# Patient Record
Sex: Female | Born: 1975 | Race: White | Hispanic: No | Marital: Married | Smoking: Never smoker
Health system: Southern US, Community
[De-identification: ages and names within clinical notes are randomized; demographics above are authoritative.]

## PROBLEM LIST (undated history)

## (undated) DIAGNOSIS — N200 Calculus of kidney: Secondary | ICD-10-CM

## (undated) DIAGNOSIS — Z789 Other specified health status: Secondary | ICD-10-CM

## (undated) DIAGNOSIS — F419 Anxiety disorder, unspecified: Secondary | ICD-10-CM

## (undated) HISTORY — PX: DILATION AND CURETTAGE OF UTERUS: SHX78

## (undated) HISTORY — PX: OTHER SURGICAL HISTORY: SHX169

## (undated) HISTORY — PX: PILONIDAL CYST EXCISION: SHX744

---

## 2006-05-02 ENCOUNTER — Inpatient Hospital Stay (HOSPITAL_COMMUNITY): Admission: AD | Admit: 2006-05-02 | Discharge: 2006-05-03 | Payer: Self-pay | Admitting: Family Medicine

## 2006-09-29 ENCOUNTER — Ambulatory Visit: Payer: Self-pay | Admitting: Family Medicine

## 2007-08-31 ENCOUNTER — Encounter (INDEPENDENT_AMBULATORY_CARE_PROVIDER_SITE_OTHER): Payer: Self-pay | Admitting: Obstetrics and Gynecology

## 2007-08-31 ENCOUNTER — Ambulatory Visit (HOSPITAL_COMMUNITY): Admission: RE | Admit: 2007-08-31 | Discharge: 2007-08-31 | Payer: Self-pay | Admitting: Obstetrics and Gynecology

## 2008-11-20 ENCOUNTER — Inpatient Hospital Stay (HOSPITAL_COMMUNITY): Admission: AD | Admit: 2008-11-20 | Discharge: 2008-11-21 | Payer: Self-pay | Admitting: Obstetrics and Gynecology

## 2009-10-04 ENCOUNTER — Ambulatory Visit: Payer: Self-pay | Admitting: Family Medicine

## 2009-10-04 DIAGNOSIS — J309 Allergic rhinitis, unspecified: Secondary | ICD-10-CM | POA: Insufficient documentation

## 2009-10-04 DIAGNOSIS — N943 Premenstrual tension syndrome: Secondary | ICD-10-CM | POA: Insufficient documentation

## 2010-03-26 NOTE — Assessment & Plan Note (Signed)
Summary: CONSULT RE: ?ALLERGIES/CJR   Vital Signs:  Patient profile:   35 year old female Weight:      105 pounds Temp:     98.2 degrees F oral BP sitting:   110 / 80  (left arm) Cuff size:   regular  Vitals Entered By: Kathrynn Speed CMA (October 04, 2009 1:54 PM) CC: consult allergies, congestion in throat, all summer, headaches once a month around [place],src   CC:  consult allergies, congestion in throat, all summer, headaches once a month around [place], and src.  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  She typically has seasonal allergic rhinitis in the spring and fall, now it's occurring in the summer.  Her major symptoms is postnasal drip and scratchy throat.  She no history of asthma.  She also has headaches about 3 days for period starts.  She describes in his posterior a 3 on a scale of one to 10.  No migraine symptoms.  She also is extremely light skin and light eyes and we will do a thorough skin exam  Preventive Screening-Counseling & Management  Alcohol-Tobacco     Smoking Status: never  Caffeine-Diet-Exercise     Does Patient Exercise: yes      Drug Use:  no.    Current Medications (verified): 1)  Multivitamins  Tabs (Multiple Vitamin) .... Once Day  Allergies (verified): No Known Drug Allergies  Past History:  Past Medical History: cb x 4  Social History: Reviewed history and no changes required. Occupation:  Charity fundraiser Married Never Smoked Alcohol use-no Drug use-no Regular exercise-yes Smoking Status:  never Drug Use:  no Does Patient Exercise:  yes  Review of Systems      See HPI  Physical Exam  General:  Well-developed,well-nourished,in no acute distress; alert,appropriate and cooperative throughout examination Head:  Normocephalic and atraumatic without obvious abnormalities. No apparent alopecia or balding. Eyes:  No corneal or conjunctival  inflammation noted. EOMI. Perrla. Funduscopic exam benign, without hemorrhages, exudates or papilledema. Vision grossly normal. Ears:  External ear exam shows no significant lesions or deformities.  Otoscopic examination reveals clear canals, tympanic membranes are intact bilaterally without bulging, retraction, inflammation or discharge. Hearing is grossly normal bilaterally. Nose:  septal deviation to the left Mouth:  Oral mucosa and oropharynx without lesions or exudates.  Teeth in good repair. Neck:  No deformities, masses, or tenderness noted. Chest Wall:  No deformities, masses, or tenderness noted. Skin:  Intact without suspicious lesions or rashes   Problems:  Medical Problems Added: 1)  Dx of Headache, Premenstrual  (ICD-625.4) 2)  Dx of Rhinitis  (ICD-477.9)  Impression & Recommendations:  Problem # 1:  HEADACHE, PREMENSTRUAL (ICD-625.4) Assessment New  Problem # 2:  RHINITIS (ICD-477.9) Assessment: New  Complete Medication List: 1)  Multivitamins Tabs (Multiple vitamin) .... Once day  Patient Instructions: 1)  you may take either plain Claritin in the morning or plain Zyrtec at that time if you give these to a good trial and it does not work then we would consider adding a steroid nasal spray. 2)  Also, Motrin 400 mg b.i.d. starting a week before you.  To help prevent the menstrual migraines.  If this is not work call us and we would try low-dose Prozac. 3)  Continue to wear sunscreens because of your light skin. 4)  Eyes

## 2010-05-31 LAB — CBC
HCT: 18.5 % — ABNORMAL LOW (ref 36.0–46.0)
HCT: 34.9 % — ABNORMAL LOW (ref 36.0–46.0)
Hemoglobin: 12 g/dL (ref 12.0–15.0)
Hemoglobin: 6.5 g/dL — CL (ref 12.0–15.0)
MCHC: 35.2 g/dL (ref 30.0–36.0)
Platelets: 199 10*3/uL (ref 150–400)
RBC: 1.88 MIL/uL — ABNORMAL LOW (ref 3.87–5.11)
RBC: 3.55 MIL/uL — ABNORMAL LOW (ref 3.87–5.11)
WBC: 10.4 10*3/uL (ref 4.0–10.5)

## 2010-07-09 NOTE — Op Note (Signed)
[name], Amber NO.:  [id number]   MEDICAL RECORD NO.:  [id number]          PATIENT TYPE:  AMB   LOCATION:  SDC                           FACILITY:  [facility]   PHYSICIAN:  Osborn Coho, M.D.   DATE OF BIRTH:  05-26-1975   DATE OF PROCEDURE:  08/31/2007  DATE OF DISCHARGE:                               OPERATIVE REPORT   PREOPERATIVE DIAGNOSIS:  Missed abortion.   POSTOPERATIVE DIAGNOSIS:  Incomplete abortion.   PROCEDURE:  Dilation and curettage.   ATTENDING:  Osborn Coho, MD   ANESTHESIA:  MAC.   SPECIMEN TO PATHOLOGY:  Products of conception.   FLUIDS:  1200 mL   URINE OUTPUT:  Voided prior to procedure.   ESTIMATED BLOOD LOSS:  Minimal.   COMPLICATIONS:  None.   PROCEDURE:  The patient was taken to the operating room and after the  risks, benefits and alternatives discussed with the patient, the patient  verbalized understanding and consent was signed and witnessed.  The  patient was placed under MAC per Anesthesia and prepped and draped in  the normal sterile fashion in the dorsal lithotomy position.  A bivalve  speculum was placed in the patient's vagina and a paracervical block  administered using a total of 10 mL of 1% lidocaine.  The anterior lip  of the cervix was grasped with a single-tooth tenaculum and the uterus  sounded to 7.5 cm.  A size 7 suction curette was used and suction and  curettage was performed until minimal tissue was returning.  Sharp  curettage was performed until a gritty texture was noted and suction and  curettage performed once again to remove any remaining debris.  There  was some oozing noted and secondary to the patient's history of  hemorrhage status post deliveries, decision was made to give the patient  a dose of Methergine.  The tenaculum was removed and there was some  bleeding at the tenaculum site which was made hemostatic with silver  nitrate.  There was minimal bleeding noted at this time.  Count  was  correct.  Speculum was removed as well.  The patient tolerated the  procedure well and is awaiting return to the recovery room in good  condition.     Osborn Coho, M.D.  Electronically Signed    AR/MEDQ  D:  08/31/2007  T:  09/01/2007  Job:  [id number]

## 2010-07-12 NOTE — H&P (Signed)
NAMERASCHELLE, WISENBAKER NO.:  [id number]   MEDICAL RECORD NO.:  [id number]          PATIENT TYPE:  INP   LOCATION:  9164                          [facility]ACILITY:  [facility]   PHYSICIAN:  Osborn Coho, M.D.   DATE OF BIRTH:  Mar 24, 1975   DATE OF ADMISSION:  05/02/2006  DATE OF DISCHARGE:                              HISTORY & PHYSICAL   Ms. Fazzino is a 35 year old married white female gravida 3, para 2-0-0-2  at 39-3/7th weeks who presents with regular uterine contractions that  began at approximately 4 p.m.  She denies bleeding or leaking of fluid.  No signs or symptoms of PIH.  No nausea, vomiting or diarrhea.  Her  pregnancy has been followed by the [facility] certified  nurse midwife service and has been remarkable for transfer of care to [place]  at 26 weeks' gestation from [place], [place], #2 Group B strep negative.   Her prenatal labs were collected on January 29, 2006.  Hemoglobin 12.7,  hematocrit 37.2, platelets 218,000.  Blood type O+, antibody negative,  RPR nonreactive, rubella immune, hepatitis B surface antigen negative,  HIV nonreactive.  Pap smear within normal limits and thyroid within  normal limits.  One-hour Glucola from February 12, 2006 was 196.  Three-  hour glucose tolerance test from March 06, 2006 was within normal  limits.  Culture of the vaginal tract for group B strep on April 06, 2006 was negative.   HISTORY OF PRESENT PREGNANCY:  The patient presented for care at [facility] on January 29, 2006 at 26-2/7th weeks gestation.  Care had  previously been provided by the [facility] in [place], [place].  Request of information for previous care was sent to her previous  office.  The patient had an elevated 1-hour Glucola but a normal 3-hour  GTT.  She remained normotensive and with no proteinuria throughout the  pregnancy.  She was measuring size equal to dates and the rest of her  prenatal care was unremarkable.   OBSTETRICAL HISTORY:  She is a gravida 3, para 2-0-0-2.  In August 2004,  she had a vaginal delivery of a female infant weighing 7 pounds 0 ounces  at 42 weeks' gestation after 12 hours in labor.  Infant's name was  Myriam Jacobson.  There were no complications with that pregnancy or birth and  that infant was delivered at home.  In March 2006, she had a vaginal  delivery of a female infant weighing 7 pounds 15 ounces at 42 weeks'  gestation after 7 hours of labor.  His name was Sherilyn Cooter.  There was no  complications and this birth to place at a birthing center.  The third  pregnancy is a current pregnancy.   MEDICAL HISTORY:  She has no medication allergies.  She experienced  menarche at the age of 40 with 28-day cycles lasting 5-6 days.  She  reports having had the usual childhood illnesses.   SURGICAL HISTORY:  Remarkable for pilonidal cyst which was removed in  1999.   FAMILY MEDICAL HISTORY:  She has aunts and  uncles with diabetes.  Mother  had some type of cancer.  First cousin with psychiatric illness.   GENETIC HISTORY:  Remarkable for father of the baby's mother has  thalassemia disorder and the patient's children are carrier.   SOCIAL HISTORY:  The patient is married to the father of the baby.  His  name is Greig Castilla.  He is involved and supportive.  They are of the  Catholic faith.  The patient has 16 years of education in nursing.  Father of the baby has 18 years of education and is employed full-time  as an Art gallery manager.  They deny any alcohol, tobacco or illicit drug use with  the pregnancy.   OBJECTIVE:  VITAL SIGNS:  Stable.  She is afebrile.  HEENT:  Grossly within normal limits.  CHEST:  Clear to auscultation.  HEART:  Regular rate and rhythm.  ABDOMEN:  Gravid and contour with fundal height extending approximately  39 cm above pubic symphysis.  Fetal heart rate is reassuring.  Contractions are every 2-3 minutes.  Upon arrival to Labor delivery, the  patient was 10 cm dilated with  infant head crowning.  EXTREMITIES:  Within normal limits.   ASSESSMENT:  1. Intrauterine pregnancy at term.  2. Imminent delivery.   PLAN:  Admit to birthing suites and prepare for birth.      Cam Hai, C.N.M.      Osborn Coho, M.D.  Electronically Signed    KS/MEDQ  D:  05/02/2006  T:  05/02/2006  Job:  [id number]

## 2010-11-21 LAB — CBC
Hemoglobin: 13.8
MCHC: 34.9
MCV: 90.3
RBC: 4.38

## 2010-11-21 LAB — ABO/RH: ABO/RH(D): O POS

## 2011-01-30 ENCOUNTER — Encounter: Payer: Self-pay | Admitting: Family Medicine

## 2011-01-30 ENCOUNTER — Ambulatory Visit (INDEPENDENT_AMBULATORY_CARE_PROVIDER_SITE_OTHER): Payer: Managed Care, Other (non HMO) | Admitting: Family Medicine

## 2011-01-30 VITALS — BP 126/84 | Temp 98.6°F | Wt 114.0 lb

## 2011-01-30 DIAGNOSIS — F410 Panic disorder [episodic paroxysmal anxiety] without agoraphobia: Secondary | ICD-10-CM | POA: Insufficient documentation

## 2011-01-30 MED ORDER — LORAZEPAM 0.5 MG PO TABS: ORAL_TABLET | ORAL | Status: AC

## 2011-01-30 MED ORDER — CITALOPRAM HYDROBROMIDE 10 MG PO TABS: 10.0000 mg | ORAL_TABLET | Freq: Every day | ORAL | Status: DC

## 2011-01-30 NOTE — Progress Notes (Signed)
  Subjective:    Patient ID: [name], female    DOB: Feb 25, 1975, 35 y.o.   MRN: [id number]  HPI [name] is a 35 year old, married female, nonsmoker comes in today for evaluation of panic attacks.  She states she has been shy all her life and has had panic attacks since he was a child.  Recently seemed to be gotten worse.  She went to see a psychologist is going to work with her on visual imaging and the psychologist recommend she take some medication.  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  Does not smoke or drink.  LMP a month ago.  Birth control. ...........  Natural family planning   Review of Systems    General and psychiatric review of systems otherwise negative Objective:   Physical Exam Well-developed well-nourished, in no acute distress.  Psychiatric evaluation oriented x 3.  Her mood is good.  Insight good.  No depression       Assessment & Plan:  Panic attacks begin Celexa 10 mg nightly, Ativan .5 p.r.n. Return in 6 weeks

## 2011-01-30 NOTE — Patient Instructions (Signed)
Began Celexa 10 mg at bedtime.  Ativan p.r.n.  Follow-up in 6 weeks

## 2011-03-13 ENCOUNTER — Other Ambulatory Visit (INDEPENDENT_AMBULATORY_CARE_PROVIDER_SITE_OTHER): Payer: Managed Care, Other (non HMO)

## 2011-03-13 DIAGNOSIS — Z0000 Encounter for general adult medical examination without abnormal findings: Secondary | ICD-10-CM

## 2011-03-13 LAB — POCT URINALYSIS DIPSTICK
Bilirubin, UA: NEGATIVE
Blood, UA: NEGATIVE
Nitrite, UA: NEGATIVE
Urobilinogen, UA: 0.2
pH, UA: 7

## 2011-03-13 LAB — CBC WITH DIFFERENTIAL/PLATELET
Basophils Absolute: 0 10*3/uL (ref 0.0–0.1)
Eosinophils Absolute: 0.1 10*3/uL (ref 0.0–0.7)
Lymphocytes Relative: 34.3 % (ref 12.0–46.0)
MCHC: 34.6 g/dL (ref 30.0–36.0)
Neutrophils Relative %: 56.4 % (ref 43.0–77.0)
RDW: 12.7 % (ref 11.5–14.6)

## 2011-03-13 LAB — HEPATIC FUNCTION PANEL
Alkaline Phosphatase: 70 U/L (ref 39–117)
Bilirubin, Direct: 0 mg/dL (ref 0.0–0.3)

## 2011-03-13 LAB — LIPID PANEL
HDL: 60.1 mg/dL (ref >39.00)
LDL Cholesterol: 112 mg/dL — ABNORMAL HIGH (ref 0–99)
Total CHOL/HDL Ratio: 3
Triglycerides: 86 mg/dL (ref 0.0–149.0)

## 2011-03-13 LAB — BASIC METABOLIC PANEL
CO2: 26 mEq/L (ref 19–32)
Calcium: 9 mg/dL (ref 8.4–10.5)
Creatinine, Ser: 0.6 mg/dL (ref 0.4–1.2)
Glucose, Bld: 83 mg/dL (ref 70–99)

## 2011-04-01 ENCOUNTER — Encounter: Payer: Self-pay | Admitting: Family Medicine

## 2011-07-15 ENCOUNTER — Ambulatory Visit: Payer: Managed Care, Other (non HMO) | Admitting: Family Medicine

## 2011-12-06 ENCOUNTER — Other Ambulatory Visit: Payer: Self-pay | Admitting: Family Medicine

## 2012-06-04 LAB — OB RESULTS CONSOLE ABO/RH: RH Type: POSITIVE

## 2012-06-04 LAB — OB RESULTS CONSOLE RUBELLA ANTIBODY, IGM: Rubella: IMMUNE

## 2012-06-04 LAB — OB RESULTS CONSOLE GC/CHLAMYDIA: Gonorrhea: NEGATIVE

## 2012-06-04 LAB — OB RESULTS CONSOLE ANTIBODY SCREEN: Antibody Screen: NEGATIVE

## 2012-11-16 ENCOUNTER — Encounter (HOSPITAL_COMMUNITY): Payer: Self-pay | Admitting: Pharmacist

## 2012-11-18 ENCOUNTER — Encounter (HOSPITAL_COMMUNITY)
Admission: RE | Admit: 2012-11-18 | Discharge: 2012-11-18 | Disposition: A | Discharge status: Home / Self Care | Payer: Managed Care, Other (non HMO) | Source: Ambulatory Visit | Attending: Obstetrics and Gynecology | Admitting: Obstetrics and Gynecology

## 2012-11-18 ENCOUNTER — Encounter (HOSPITAL_COMMUNITY): Payer: Self-pay

## 2012-11-18 HISTORY — DX: Anxiety disorder, unspecified: F41.9

## 2012-11-18 HISTORY — DX: Other specified health status: Z78.9

## 2012-11-18 LAB — RPR: RPR Ser Ql: NONREACTIVE

## 2012-11-18 LAB — CBC
Hemoglobin: 11.1 g/dL — ABNORMAL LOW (ref 12.0–15.0)
MCH: 31.4 pg (ref 26.0–34.0)
MCHC: 35.2 g/dL (ref 30.0–36.0)
MCV: 89 fL (ref 78.0–100.0)

## 2012-11-18 NOTE — Patient Instructions (Addendum)
Your procedure is scheduled on:11/19/12  Enter through the Main Entrance at :12 noon Pick up desk phone and dial [phone] and inform us of your arrival.  Please call [phone] if you have any problems the morning of surgery.  Remember: Do not eat food after midnight:tonight Clear liquids are ok until: 9am Friday   You may brush your teeth the morning of surgery.   DO NOT wear jewelry, eye make-up, lipstick,body lotion, or dark fingernail polish.  (Polished toes are ok) You may wear deodorant.  If you are to be admitted after surgery, leave suitcase in car until your room has been assigned.

## 2012-11-19 ENCOUNTER — Encounter (HOSPITAL_COMMUNITY): Payer: Self-pay | Admitting: Anesthesiology

## 2012-11-19 ENCOUNTER — Encounter (HOSPITAL_COMMUNITY)
Admission: AD | Disposition: A | Discharge status: Home / Self Care | Payer: Self-pay | Source: Ambulatory Visit | Attending: Obstetrics and Gynecology

## 2012-11-19 ENCOUNTER — Inpatient Hospital Stay (HOSPITAL_COMMUNITY)
Admission: AD | Admit: 2012-11-19 | Discharge: 2012-11-22 | DRG: 765 | Disposition: A | Discharge status: Home / Self Care | Payer: Managed Care, Other (non HMO) | Source: Ambulatory Visit | Attending: Obstetrics and Gynecology | Admitting: Obstetrics and Gynecology

## 2012-11-19 ENCOUNTER — Inpatient Hospital Stay (HOSPITAL_COMMUNITY): Payer: Managed Care, Other (non HMO) | Admitting: Anesthesiology

## 2012-11-19 DIAGNOSIS — D649 Anemia, unspecified: Secondary | ICD-10-CM | POA: Diagnosis not present

## 2012-11-19 DIAGNOSIS — O365990 Maternal care for other known or suspected poor fetal growth, unspecified trimester, not applicable or unspecified: Secondary | ICD-10-CM | POA: Diagnosis present

## 2012-11-19 DIAGNOSIS — O09529 Supervision of elderly multigravida, unspecified trimester: Secondary | ICD-10-CM | POA: Diagnosis present

## 2012-11-19 DIAGNOSIS — O9903 Anemia complicating the puerperium: Secondary | ICD-10-CM | POA: Diagnosis not present

## 2012-11-19 DIAGNOSIS — O4410 Placenta previa with hemorrhage, unspecified trimester: Principal | ICD-10-CM | POA: Diagnosis present

## 2012-11-19 LAB — DIC (DISSEMINATED INTRAVASCULAR COAGULATION) PANEL: Smear Review: NONE SEEN

## 2012-11-19 LAB — DIC (DISSEMINATED INTRAVASCULAR COAGULATION)PANEL
D-Dimer, Quant: 13.48 ug/mL-FEU — ABNORMAL HIGH (ref 0.00–0.48)
Fibrinogen: 287 mg/dL (ref 204–475)
INR: 1.1 (ref 0.00–1.49)
Prothrombin Time: 14 seconds (ref 11.6–15.2)
aPTT: 24 seconds (ref 24–37)

## 2012-11-19 LAB — CBC
HCT: 21.7 % — ABNORMAL LOW (ref 36.0–46.0)
Hemoglobin: 7.8 g/dL — ABNORMAL LOW (ref 12.0–15.0)
MCH: 32 pg (ref 26.0–34.0)
MCHC: 35.9 g/dL (ref 30.0–36.0)
MCV: 88.9 fL (ref 78.0–100.0)
Platelets: 215 10*3/uL (ref 150–400)
RDW: 12.8 % (ref 11.5–15.5)
WBC: 14.7 10*3/uL — ABNORMAL HIGH (ref 4.0–10.5)

## 2012-11-19 LAB — POSTPARTUM HEMORRHAGE PROTOCOL (BB NOTIFICATION)

## 2012-11-19 LAB — PREPARE RBC (CROSSMATCH)

## 2012-11-19 SURGERY — Surgical Case
Anesthesia: Spinal | Site: Abdomen | Wound class: Clean Contaminated

## 2012-11-19 MED ORDER — NALOXONE HCL 1 MG/ML IJ SOLN: 1.0000 ug/kg/h | INTRAVENOUS | Status: DC | PRN

## 2012-11-19 MED ORDER — METOCLOPRAMIDE HCL 5 MG/ML IJ SOLN
10.0000 mg | Freq: Three times a day (TID) | INTRAMUSCULAR | Status: DC | PRN
  Filled 2012-11-19: qty 2

## 2012-11-19 MED ORDER — BUTORPHANOL TARTRATE 1 MG/ML IJ SOLN
2.0000 mg | Freq: Once | INTRAMUSCULAR | Status: AC
  Administered 2012-11-19: 1 mg via INTRAVENOUS
  Filled 2012-11-19: qty 2

## 2012-11-19 MED ORDER — CITALOPRAM HYDROBROMIDE 20 MG PO TABS
20.0000 mg | ORAL_TABLET | Freq: Every day | ORAL | Status: DC
  Administered 2012-11-20 – 2012-11-22 (×3): 20 mg via ORAL
  Filled 2012-11-19 (×5): qty 1

## 2012-11-19 MED ORDER — DIPHENHYDRAMINE HCL 25 MG PO CAPS: 25.0000 mg | ORAL_CAPSULE | ORAL | Status: DC | PRN

## 2012-11-19 MED ORDER — DIPHENHYDRAMINE HCL 50 MG/ML IJ SOLN: 12.5000 mg | INTRAMUSCULAR | Status: DC | PRN

## 2012-11-19 MED ORDER — FENTANYL CITRATE 0.05 MG/ML IJ SOLN
INTRAMUSCULAR | Status: DC | PRN
  Administered 2012-11-19: 100 ug via EPIDURAL

## 2012-11-19 MED ORDER — CEFAZOLIN SODIUM-DEXTROSE 2-3 GM-% IV SOLR
2.0000 g | Freq: Once | INTRAVENOUS | Status: AC
  Administered 2012-11-19: 2 g via INTRAVENOUS

## 2012-11-19 MED ORDER — KETOROLAC TROMETHAMINE 60 MG/2ML IM SOLN: 60.0000 mg | Freq: Once | INTRAMUSCULAR | Status: AC | PRN

## 2012-11-19 MED ORDER — MORPHINE SULFATE 0.5 MG/ML IJ SOLN
INTRAMUSCULAR | Status: AC
  Filled 2012-11-19: qty 10

## 2012-11-19 MED ORDER — PRENATAL MULTIVITAMIN CH
1.0000 | ORAL_TABLET | Freq: Every day | ORAL | Status: DC
  Administered 2012-11-20 – 2012-11-22 (×3): 1 via ORAL
  Filled 2012-11-19 (×3): qty 1

## 2012-11-19 MED ORDER — SIMETHICONE 80 MG PO CHEW: 80.0000 mg | CHEWABLE_TABLET | ORAL | Status: DC

## 2012-11-19 MED ORDER — NALBUPHINE HCL 10 MG/ML IJ SOLN: 5.0000 mg | INTRAMUSCULAR | Status: DC | PRN

## 2012-11-19 MED ORDER — SODIUM CHLORIDE 0.9 % IJ SOLN: 3.0000 mL | INTRAMUSCULAR | Status: DC | PRN

## 2012-11-19 MED ORDER — LACTATED RINGERS IV SOLN: INTRAVENOUS | Status: DC | PRN

## 2012-11-19 MED ORDER — ONDANSETRON HCL 4 MG/2ML IJ SOLN: 4.0000 mg | INTRAMUSCULAR | Status: DC | PRN

## 2012-11-19 MED ORDER — MENTHOL 3 MG MT LOZG: 1.0000 | LOZENGE | OROMUCOSAL | Status: DC | PRN

## 2012-11-19 MED ORDER — TETANUS-DIPHTH-ACELL PERTUSSIS 5-2.5-18.5 LF-MCG/0.5 IM SUSP: 0.5000 mL | Freq: Once | INTRAMUSCULAR | Status: DC

## 2012-11-19 MED ORDER — OXYCODONE-ACETAMINOPHEN 5-325 MG PO TABS: 1.0000 | ORAL_TABLET | ORAL | Status: DC | PRN

## 2012-11-19 MED ORDER — SCOPOLAMINE 1 MG/3DAYS TD PT72
1.0000 | MEDICATED_PATCH | Freq: Once | TRANSDERMAL | Status: AC
  Administered 2012-11-19: 1.5 mg via TRANSDERMAL

## 2012-11-19 MED ORDER — FENTANYL CITRATE 0.05 MG/ML IJ SOLN
INTRAMUSCULAR | Status: AC
  Filled 2012-11-19: qty 2

## 2012-11-19 MED ORDER — SIMETHICONE 80 MG PO CHEW: 80.0000 mg | CHEWABLE_TABLET | ORAL | Status: DC | PRN

## 2012-11-19 MED ORDER — MORPHINE SULFATE (PF) 0.5 MG/ML IJ SOLN
INTRAMUSCULAR | Status: DC | PRN
  Administered 2012-11-19: 4 mg via EPIDURAL

## 2012-11-19 MED ORDER — HYDROMORPHONE HCL PF 1 MG/ML IJ SOLN: 0.2500 mg | INTRAMUSCULAR | Status: DC | PRN

## 2012-11-19 MED ORDER — ONDANSETRON HCL 4 MG PO TABS: 4.0000 mg | ORAL_TABLET | ORAL | Status: DC | PRN

## 2012-11-19 MED ORDER — METHYLERGONOVINE MALEATE 0.2 MG/ML IJ SOLN
INTRAMUSCULAR | Status: DC | PRN
  Administered 2012-11-19: 0.2 mg via INTRAMUSCULAR

## 2012-11-19 MED ORDER — METHYLERGONOVINE MALEATE 0.2 MG PO TABS
0.2000 mg | ORAL_TABLET | ORAL | Status: DC | PRN
  Administered 2012-11-19 – 2012-11-20 (×2): 0.2 mg via ORAL
  Filled 2012-11-19 (×2): qty 1

## 2012-11-19 MED ORDER — MEPERIDINE HCL 25 MG/ML IJ SOLN: 6.2500 mg | INTRAMUSCULAR | Status: DC | PRN

## 2012-11-19 MED ORDER — OXYTOCIN 40 UNITS IN LACTATED RINGERS INFUSION - SIMPLE MED: 62.5000 mL/h | INTRAVENOUS | Status: AC

## 2012-11-19 MED ORDER — PHENYLEPHRINE 40 MCG/ML (10ML) SYRINGE FOR IV PUSH (FOR BLOOD PRESSURE SUPPORT)
PREFILLED_SYRINGE | INTRAVENOUS | Status: AC
  Filled 2012-11-19: qty 10

## 2012-11-19 MED ORDER — KETOROLAC TROMETHAMINE 30 MG/ML IJ SOLN: 30.0000 mg | Freq: Four times a day (QID) | INTRAMUSCULAR | Status: AC | PRN

## 2012-11-19 MED ORDER — ONDANSETRON HCL 4 MG/2ML IJ SOLN: 4.0000 mg | Freq: Three times a day (TID) | INTRAMUSCULAR | Status: DC | PRN

## 2012-11-19 MED ORDER — KETOROLAC TROMETHAMINE 30 MG/ML IJ SOLN
INTRAMUSCULAR | Status: AC
  Administered 2012-11-19: 30 mg via INTRAVENOUS
  Filled 2012-11-19: qty 1

## 2012-11-19 MED ORDER — DIPHENHYDRAMINE HCL 50 MG/ML IJ SOLN: 25.0000 mg | INTRAMUSCULAR | Status: DC | PRN

## 2012-11-19 MED ORDER — LACTATED RINGERS IV SOLN
Freq: Once | INTRAVENOUS | Status: AC
  Administered 2012-11-19: 13:00:00 via INTRAVENOUS

## 2012-11-19 MED ORDER — DIBUCAINE 1 % RE OINT: 1.0000 "application " | TOPICAL_OINTMENT | RECTAL | Status: DC | PRN

## 2012-11-19 MED ORDER — BUPIVACAINE HCL (PF) 0.25 % IJ SOLN
INTRAMUSCULAR | Status: AC
  Filled 2012-11-19: qty 30

## 2012-11-19 MED ORDER — PROMETHAZINE HCL 25 MG/ML IJ SOLN: 6.2500 mg | INTRAMUSCULAR | Status: DC | PRN

## 2012-11-19 MED ORDER — NALOXONE HCL 0.4 MG/ML IJ SOLN: 0.4000 mg | INTRAMUSCULAR | Status: DC | PRN

## 2012-11-19 MED ORDER — CEFAZOLIN SODIUM-DEXTROSE 2-3 GM-% IV SOLR
INTRAVENOUS | Status: AC
  Filled 2012-11-19: qty 50

## 2012-11-19 MED ORDER — KETOROLAC TROMETHAMINE 30 MG/ML IJ SOLN
15.0000 mg | Freq: Once | INTRAMUSCULAR | Status: AC | PRN
  Administered 2012-11-19: 30 mg via INTRAVENOUS

## 2012-11-19 MED ORDER — BUPIVACAINE IN DEXTROSE 0.75-8.25 % IT SOLN
INTRATHECAL | Status: DC | PRN
  Administered 2012-11-19: 1 mL via INTRATHECAL

## 2012-11-19 MED ORDER — ZOLPIDEM TARTRATE 5 MG PO TABS: 5.0000 mg | ORAL_TABLET | Freq: Every evening | ORAL | Status: DC | PRN

## 2012-11-19 MED ORDER — LACTATED RINGERS IV SOLN
INTRAVENOUS | Status: DC
  Administered 2012-11-20: 02:00:00 via INTRAVENOUS

## 2012-11-19 MED ORDER — SIMETHICONE 80 MG PO CHEW
80.0000 mg | CHEWABLE_TABLET | Freq: Three times a day (TID) | ORAL | Status: DC
  Administered 2012-11-20 – 2012-11-22 (×9): 80 mg via ORAL

## 2012-11-19 MED ORDER — DIPHENHYDRAMINE HCL 25 MG PO CAPS: 25.0000 mg | ORAL_CAPSULE | Freq: Four times a day (QID) | ORAL | Status: DC | PRN

## 2012-11-19 MED ORDER — ONDANSETRON HCL 4 MG/2ML IJ SOLN
INTRAMUSCULAR | Status: AC
  Filled 2012-11-19: qty 2

## 2012-11-19 MED ORDER — ONDANSETRON HCL 4 MG/2ML IJ SOLN
INTRAMUSCULAR | Status: DC | PRN
  Administered 2012-11-19: 4 mg via INTRAVENOUS

## 2012-11-19 MED ORDER — 0.9 % SODIUM CHLORIDE (POUR BTL) OPTIME
TOPICAL | Status: DC | PRN
  Administered 2012-11-19: 1000 mL

## 2012-11-19 MED ORDER — OXYTOCIN 10 UNIT/ML IJ SOLN
40.0000 [IU] | INTRAVENOUS | Status: DC | PRN
  Administered 2012-11-19: 40 [IU] via INTRAVENOUS

## 2012-11-19 MED ORDER — LANOLIN HYDROUS EX OINT: 1.0000 "application " | TOPICAL_OINTMENT | CUTANEOUS | Status: DC | PRN

## 2012-11-19 MED ORDER — METHYLERGONOVINE MALEATE 0.2 MG/ML IJ SOLN
0.2000 mg | Freq: Once | INTRAMUSCULAR | Status: AC
  Administered 2012-11-19: 0.2 mg via INTRAMUSCULAR
  Filled 2012-11-19: qty 1

## 2012-11-19 MED ORDER — LACTATED RINGERS IV SOLN: INTRAVENOUS | Status: DC

## 2012-11-19 MED ORDER — WITCH HAZEL-GLYCERIN EX PADS: 1.0000 "application " | MEDICATED_PAD | CUTANEOUS | Status: DC | PRN

## 2012-11-19 MED ORDER — LACTATED RINGERS IV SOLN
INTRAVENOUS | Status: DC
  Administered 2012-11-19 (×3): via INTRAVENOUS

## 2012-11-19 MED ORDER — BUPIVACAINE HCL (PF) 0.25 % IJ SOLN
INTRAMUSCULAR | Status: DC | PRN
  Administered 2012-11-19: 10 mL

## 2012-11-19 MED ORDER — OXYTOCIN 10 UNIT/ML IJ SOLN
INTRAMUSCULAR | Status: AC
  Filled 2012-11-19: qty 4

## 2012-11-19 MED ORDER — SCOPOLAMINE 1 MG/3DAYS TD PT72
MEDICATED_PATCH | TRANSDERMAL | Status: AC
  Administered 2012-11-19: 1.5 mg via TRANSDERMAL
  Filled 2012-11-19: qty 1

## 2012-11-19 MED ORDER — SENNOSIDES-DOCUSATE SODIUM 8.6-50 MG PO TABS
2.0000 | ORAL_TABLET | ORAL | Status: DC
  Administered 2012-11-20 – 2012-11-21 (×2): 2 via ORAL

## 2012-11-19 MED ORDER — SODIUM BICARBONATE 8.4 % IV SOLN
INTRAVENOUS | Status: DC | PRN
  Administered 2012-11-19: 3 mL via EPIDURAL

## 2012-11-19 MED ORDER — PHENYLEPHRINE HCL 10 MG/ML IJ SOLN
INTRAMUSCULAR | Status: DC | PRN
  Administered 2012-11-19: 80 ug via INTRAVENOUS
  Administered 2012-11-19 (×3): 40 ug via INTRAVENOUS
  Administered 2012-11-19: 80 ug via INTRAVENOUS
  Administered 2012-11-19 (×2): 40 ug via INTRAVENOUS
  Administered 2012-11-19 (×3): 80 ug via INTRAVENOUS
  Administered 2012-11-19 (×2): 40 ug via INTRAVENOUS
  Administered 2012-11-19 (×2): 80 ug via INTRAVENOUS

## 2012-11-19 MED ORDER — IBUPROFEN 600 MG PO TABS
600.0000 mg | ORAL_TABLET | Freq: Four times a day (QID) | ORAL | Status: DC
  Administered 2012-11-20 – 2012-11-22 (×10): 600 mg via ORAL
  Filled 2012-11-19 (×10): qty 1

## 2012-11-19 MED ORDER — PHENYLEPHRINE 40 MCG/ML (10ML) SYRINGE FOR IV PUSH (FOR BLOOD PRESSURE SUPPORT)
PREFILLED_SYRINGE | INTRAVENOUS | Status: AC
  Filled 2012-11-19: qty 5

## 2012-11-19 MED ORDER — CARBOPROST TROMETHAMINE 250 MCG/ML IM SOLN
250.0000 ug | INTRAMUSCULAR | Status: DC | PRN
  Administered 2012-11-19: 250 ug via INTRAMUSCULAR
  Filled 2012-11-19: qty 1

## 2012-11-19 MED ORDER — SODIUM CHLORIDE 0.9 % IV SOLN
INTRAVENOUS | Status: DC | PRN
  Administered 2012-11-19: 14:00:00 via INTRAVENOUS

## 2012-11-19 SURGICAL SUPPLY — 32 items
BARRIER ADHS 3X4 INTERCEED (GAUZE/BANDAGES/DRESSINGS) IMPLANT
CLAMP CORD UMBIL (MISCELLANEOUS) ×2 IMPLANT
CLOTH BEACON ORANGE TIMEOUT ST (SAFETY) ×2 IMPLANT
CONTAINER PREFILL 10% NBF 15ML (MISCELLANEOUS) IMPLANT
DERMABOND ADVANCED (GAUZE/BANDAGES/DRESSINGS) ×1
DERMABOND ADVANCED .7 DNX12 (GAUZE/BANDAGES/DRESSINGS) ×1 IMPLANT
DRAPE LG THREE QUARTER DISP (DRAPES) ×4 IMPLANT
DRSG OPSITE POSTOP 4X10 (GAUZE/BANDAGES/DRESSINGS) ×2 IMPLANT
DURAPREP 26ML APPLICATOR (WOUND CARE) ×2 IMPLANT
ELECT REM PT RETURN 9FT ADLT (ELECTROSURGICAL) ×2
ELECTRODE REM PT RTRN 9FT ADLT (ELECTROSURGICAL) ×1 IMPLANT
EXTRACTOR VACUUM M CUP 4 TUBE (SUCTIONS) IMPLANT
GLOVE BIO SURGEON STRL SZ 6.5 (GLOVE) ×2 IMPLANT
GOWN PREVENTION PLUS XLARGE (GOWN DISPOSABLE) ×4 IMPLANT
GOWN STRL REIN XL XLG (GOWN DISPOSABLE) ×4 IMPLANT
KIT ABG SYR 3ML LUER SLIP (SYRINGE) IMPLANT
NEEDLE HYPO 22GX1.5 SAFETY (NEEDLE) ×2 IMPLANT
NEEDLE HYPO 25X5/8 SAFETYGLIDE (NEEDLE) ×2 IMPLANT
NS IRRIG 1000ML POUR BTL (IV SOLUTION) ×2 IMPLANT
PACK C SECTION WH (CUSTOM PROCEDURE TRAY) ×2 IMPLANT
PAD OB MATERNITY 4.3X12.25 (PERSONAL CARE ITEMS) ×2 IMPLANT
STAPLER VISISTAT 35W (STAPLE) IMPLANT
SUT CHROMIC 0 CTX 36 (SUTURE) ×6 IMPLANT
SUT PLAIN 0 NONE (SUTURE) IMPLANT
SUT PLAIN 2 0 XLH (SUTURE) IMPLANT
SUT VIC AB 0 CT1 27 (SUTURE) ×3
SUT VIC AB 0 CT1 27XBRD ANBCTR (SUTURE) ×3 IMPLANT
SUT VIC AB 4-0 KS 27 (SUTURE) ×2 IMPLANT
SYR CONTROL 10ML LL (SYRINGE) ×2 IMPLANT
TOWEL OR 17X24 6PK STRL BLUE (TOWEL DISPOSABLE) ×2 IMPLANT
TRAY FOLEY CATH 14FR (SET/KITS/TRAYS/PACK) ×2 IMPLANT
WATER STERILE IRR 1000ML POUR (IV SOLUTION) IMPLANT

## 2012-11-19 NOTE — Progress Notes (Signed)
H and P on the chart. No significant changes Discussed with patient risks of hemorrhage, blood transfusion and risk of hysterectomy Consent signed

## 2012-11-19 NOTE — Progress Notes (Signed)
I went into to the patients 2 hour out assesement and vitals. The patient stated she didn't feel good. When I entered the room she was pale\white and noncoherent. We used amnonia capsules x2 to get her to respond. I palpated her fundus it was even and firm but lochia was severe with many large clots. Her sats at that time were 96% on room air. I then initiated a post partum hemorrhage protocol and called rapid response team. Rapid response came and hooked the patient up to LR. Dr. Perlie Gold was called and arrived in the room. Hemabate and Methergine given IM per Dr. Perlie Gold orders. Dr. Perlie Gold extracted many clots from the patient (800cc blood loss estimated). By 18:30 the patient was coherant color was pink, sats 98 on 4 liters of oxygen. Fundus firm no clots extracted. Shift report given to oncoming RN at 7pm. Will monitor status.

## 2012-11-19 NOTE — Brief Op Note (Signed)
11/19/2012  2:29 PM  PATIENT:  [name]  37 y.o. female  PRE-OPERATIVE DIAGNOSIS:   IUP at 37 weeks and 1 day Complete Placenta Previa  POST-OPERATIVE DIAGNOSIS:   Same  PROCEDURE:  Procedure(s) with comments: PRIMARY CESAREAN SECTION (N/A) - Primary edc 10/16  SURGEON:  Surgeon(s) and Role:    * Jeani Hawking, MD - Primary    * Zelphia Cairo, MD - Assisting  PHYSICIAN ASSISTANT:   ASSISTANTS: none   ANESTHESIA:   epidural  EBL:  Total I/O In: 2400 [I.V.:2400] Out: 1300 [Urine:300; Blood:1000]  BLOOD ADMINISTERED:none  DRAINS: Urinary Catheter (Foley)   LOCAL MEDICATIONS USED:  MARCAINE     SPECIMEN:  Source of Specimen:  placenta  DISPOSITION OF SPECIMEN:  PATHOLOGY  COUNTS:  YES  TOURNIQUET:  * No tourniquets in log *  DICTATION: .Other Dictation: Dictation Number [id number]  PLAN OF CARE: Admit to inpatient   PATIENT DISPOSITION:  PACU - hemodynamically stable.   Delay start of Pharmacological VTE agent (>24hrs) due to surgical blood loss or risk of bleeding: not applicable

## 2012-11-19 NOTE — Anesthesia Postprocedure Evaluation (Signed)
Anesthesia Post Note  Patient: [name]  Procedure(s) Performed: Procedure(s) (LRB): PRIMARY CESAREAN SECTION (N/A)  Anesthesia type: Spinal  Patient location: PACU  Post pain: Pain level controlled  Post assessment: Post-op Vital signs reviewed  Last Vitals:  Filed Vitals:   11/19/12 1500  BP: 103/59  Pulse: 73  Temp:   Resp: 16    Post vital signs: Reviewed  Level of consciousness: awake  Complications: No apparent anesthesia complications

## 2012-11-19 NOTE — Anesthesia Procedure Notes (Signed)
Spinal  Patient location during procedure: OR Start time: 11/19/2012 1:37 PM End time: 11/19/2012 1:42 PM Staffing Anesthesiologist: Sandrea Hughs Performed by: anesthesiologist  Preanesthetic Checklist Completed: patient identified, surgical consent, pre-op evaluation, timeout performed, IV checked, risks and benefits discussed and monitors and equipment checked Spinal Block Patient position: sitting Prep: DuraPrep Patient monitoring: cardiac monitor, continuous pulse ox, blood pressure and heart rate Approach: midline Location: L3-4 Injection technique: catheter Needle Needle type: Tuohy and Sprotte  Needle gauge: 24 G Needle length: 12.7 cm Needle insertion depth: 5 cm Catheter type: closed end flexible Catheter size: 19 g Catheter at skin depth: 10 cm Assessment Sensory level: T6

## 2012-11-19 NOTE — H&P (Signed)
[name] is a 37 y.o. female presenting for Primary LTCS and possible hysterectomy for complete placenta previa.  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 She has not had any episodes of vaginal bleeding. She was seen in the office this week and it was determined that her C Section was originally scheduled at 38 weeks. Her primary, Dr Renaldo Fiddler, spoke to the patient via telephone about potential of vaginal bleeding between 37 weeks and 38 weeks and the possibility of increased morbidity in regards to this. She was counseled about the potential risk of hysterectomy with complete placenta previa. This is an anterior placenta previa so there is approximately 1 to 5 percent change of accreta requiring massive transfusion. She changed her C Section date to today and Dr. Renaldo Fiddler requested that I perform her C Section. OB history for 4 vaginal deliveries - she did report increased bleeding after each one of her vaginal deliveries. She has a history of rapid labor History OB History   Grav Para Term Preterm Abortions TAB SAB Ect Mult Living   6    1     4      Past Medical History  Diagnosis Date  . Medical history non-contributory   . Anxiety    Past Surgical History  Procedure Laterality Date  . Childbirth      x 4  . Pilonidal cyst excision    . Dilation and curettage of uterus     Family History: family history is not on file. Social History:  reports that she has never smoked. She has never used smokeless tobacco. She reports that  drinks alcohol. Her drug history is not on file.   Prenatal Transfer Tool  Maternal Diabetes: No Genetic Screening: Normal Maternal Ultrasounds/Referrals: Complete Placenta Previa Fetal Ultrasounds or other Referrals:  None Maternal Substance Abuse:  No Significant Maternal Medications:  None Significant Maternal Lab Results:  None Other Comments:  None  Review of Systems  All other systems reviewed and are negative.       Last menstrual period 03/04/2012. Exam Physical Exam  Nursing note and vitals reviewed. Constitutional: She appears well-developed.  Cardiovascular: Normal rate and normal heart sounds.   Respiratory: Effort normal and breath sounds normal.  GI: Soft.    Prenatal labs: ABO, Rh: --/--/O POS (09/25 1420) Antibody: NEG (09/25 1420) Rubella: Immune (04/11 0000) RPR: NON REACTIVE (09/25 1420)  HBsAg: Negative (04/11 0000)  HIV: Non-reactive (04/11 0000)  GBS:     Assessment/Plan: IUP at 37 and 2  Complete Placenta Previa  I will consent patient for Primary C Section and possible hysterectomy Possibility of need for massive transfusion will be discussed with the patient Possibility of increased morbidity/ mortality will be discussed   [name] L 11/19/2012, 8:55 AM

## 2012-11-19 NOTE — Consult Note (Signed)
Neonatology Note:   Attendance at C-section:    I was asked by Dr. Vincente Poli to attend this repeat C/S at 37 weeks due to placenta previa. The mother is a G6P4A1 O pos, GBS neg with anxiety and a history of panic attacks. ROM at delivery, fluid clear. Infant vigorous with good spontaneous cry and tone. Needed only minimal bulb suctioning. Ap 8/9. Baby is noted to be SGA, and parents were informed of the increased risk for hypothermia and hypoglycemia due to LBW. Lungs clear to ausc in DR. To CN to care of Pediatrician.   Doretha Sou, MD

## 2012-11-19 NOTE — Anesthesia Preprocedure Evaluation (Signed)
Anesthesia Evaluation  Patient identified by MRN, date of birth, ID band Patient awake    Reviewed: Allergy & Precautions, H&P , NPO status , Patient's Chart, lab work & pertinent test results  Airway Mallampati: I TM Distance: >3 FB Neck ROM: full    Dental no notable dental hx.    Pulmonary neg pulmonary ROS,    Pulmonary exam normal       Cardiovascular negative cardio ROS      Neuro/Psych negative neurological ROS     GI/Hepatic negative GI ROS, Neg liver ROS,   Endo/Other  negative endocrine ROS  Renal/GU negative Renal ROS  negative genitourinary   Musculoskeletal   Abdominal Normal abdominal exam  (+)   Peds  Hematology negative hematology ROS (+)   Anesthesia Other Findings   Reproductive/Obstetrics (+) Pregnancy                           Anesthesia Physical Anesthesia Plan  ASA: II  Anesthesia Plan: Combined Spinal and Epidural   Post-op Pain Management:    Induction:   Airway Management Planned:   Additional Equipment:   Intra-op Plan:   Post-operative Plan:   Informed Consent: I have reviewed the patients History and Physical, chart, labs and discussed the procedure including the risks, benefits and alternatives for the proposed anesthesia with the patient or authorized representative who has indicated his/her understanding and acceptance.     Plan Discussed with: CRNA and Surgeon  Anesthesia Plan Comments:         Anesthesia Quick Evaluation

## 2012-11-19 NOTE — Progress Notes (Signed)
Called for  Stage 2 Hemorrhage.  Arrived and patient had already received methergine. Total EBL about 800 cc I scooped several clots out and uterus is now firm Will place patient on methergine series and hemabate Check cbc Continue to observe

## 2012-11-19 NOTE — Transfer of Care (Signed)
Immediate Anesthesia Transfer of Care Note  Patient: [name]  Procedure(s) Performed: Procedure(s) with comments: PRIMARY CESAREAN SECTION (N/A) - Primary edc 10/16  Patient Location: PACU  Anesthesia Type:Spinal and Epidural  Level of Consciousness: awake and alert   Airway & Oxygen Therapy: Patient Spontanous Breathing  Post-op Assessment: Report given to PACU RN and Post -op Vital signs reviewed and stable  Post vital signs: Reviewed and stable  Complications: No apparent anesthesia complications

## 2012-11-20 LAB — CBC
Hemoglobin: 5.7 g/dL — CL (ref 12.0–15.0)
MCH: 31.1 pg (ref 26.0–34.0)
MCHC: 35.2 g/dL (ref 30.0–36.0)
MCV: 88.5 fL (ref 78.0–100.0)
RBC: 1.83 MIL/uL — ABNORMAL LOW (ref 3.87–5.11)

## 2012-11-20 LAB — D-DIMER, QUANTITATIVE (NOT AT ARMC): D-Dimer, Quant: 5.76 ug/mL-FEU — ABNORMAL HIGH (ref 0.00–0.48)

## 2012-11-20 MED ORDER — DOCUSATE SODIUM 100 MG PO CAPS
100.0000 mg | ORAL_CAPSULE | Freq: Two times a day (BID) | ORAL | Status: DC
  Administered 2012-11-20 – 2012-11-22 (×5): 100 mg via ORAL
  Filled 2012-11-20 (×5): qty 1

## 2012-11-20 MED ORDER — FERUMOXYTOL INJECTION 510 MG/17 ML
510.0000 mg | Freq: Once | INTRAVENOUS | Status: AC
  Administered 2012-11-20: 510 mg via INTRAVENOUS
  Filled 2012-11-20: qty 17

## 2012-11-20 NOTE — Op Note (Signed)
[name], [name] NO.:  [id number]  MEDICAL RECORD NO.:  [id number]  LOCATION:  9127                          FACILITY:  [facility]  PHYSICIAN:  Belynda Pagaduan L. Joselin Crandell, M.D.DATE OF BIRTH:  13-Jul-1975  DATE OF PROCEDURE:  11/19/2012 DATE OF DISCHARGE:                              OPERATIVE REPORT   PREOPERATIVE DIAGNOSES: 1. Intrauterine pregnancy at 37 weeks and 1 day. 2. Complete placenta previa.  POSTOPERATIVE DIAGNOSES: 1. Intrauterine pregnancy at 37 weeks and 1 day. 2. Complete placenta previa.  PROCEDURE:  Primary low transverse cesarean section.  SURGEON:  Andree Golphin L. Vincente Poli, MD  ASSISTANT:  Zelphia Cairo, MD  ANESTHESIA:  Epidural.  EBL:  1000 mL.  COMPLICATIONS:  None.  DRAINS:  Foley catheter.  PROCEDURE IN DETAIL:  The patient was taken to the operating room.  She had been consented about the risk associated with delivery of a patient with complete placenta previa which include the risk of hemorrhage, risk of injury to internal organs, and risk of necessity for emergency hysterectomy.  Consent was obtained.  She was taken to the operating room and epidural was placed.  She was prepped and draped.  A Foley catheter was inserted.  Allen test was performed.  A low transverse incision was made and carried down to the fascia.  Fascia was scored in the midline and extended laterally.  Rectus muscles were separated in the midline.  The peritoneum was entered bluntly.  The peritoneal incision was then stretched.  The bladder blade was inserted.  The lower uterine segment was identified and the bladder flap was created sharply and then digitally.  The bladder blade was then readjusted.  A low transverse incision was made in the uterus.  Uterus was entered using a hemostat.  The baby was in cephalic presentation.  We did have to enter even though we made the incision high above where we thought the placenta was, we did have to enter through the  placenta when we delivered the baby.  The baby was in cephalic presentation, who was a female infant, and Apgars were 8 at 1 minute, 9 at 5 minutes.  The cord was clamped and cut.  There was a double nuchal cord x2.  The baby was handed to the awaiting neonatal team.  The placenta was noted to peel out normally.  We then removed it and the uterus did not appear to be bleeding excessively.  However, we did have to give some Methergine because there was some atony. I cleaned out the uterus of all clots and debris.  It did not appear by gross inspection that we had a placenta accreta.  The uterine incision was closed in 2 layers using 0 chromic in a running locked stitch.  The uterus was returned to the abdomen. Irrigation performed.  Hemostasis was very good.  At that point, we noted the uterus was very firm.  EBL was about a 1000 mL.  The peritoneum was closed using 0 Vicryl.  The fascia was closed using 0 Vicryl starting at each corner and meeting in the midline.  After irrigation of subcutaneous layer, the skin was closed with a subcuticular using  4-0 Vicryl on a Keith needle.  Dermabond was applied. All sponge, lap, and instrument counts were correct x2.  The patient went to recovery room in stable condition.     [name] L. Vincente Poli, M.D.     Florestine Avers  D:  11/19/2012  T:  11/20/2012  Job:  [id number]

## 2012-11-20 NOTE — Anesthesia Postprocedure Evaluation (Signed)
  Anesthesia Post-op Note  Anesthesia Post Note  Patient: [name]  Procedure(s) Performed: Procedure(s) (LRB): PRIMARY CESAREAN SECTION (N/A)  Anesthesia type: Epidural  Patient location: Mother/Baby  Post pain: Pain level controlled  Post assessment: Post-op Vital signs reviewed  Last Vitals:  Filed Vitals:   11/20/12 0840  BP: 125/64  Pulse: 123  Temp: 37.3 C  Resp: 18    Post vital signs: Reviewed  Level of consciousness:alert  Complications: No apparent anesthesia complications

## 2012-11-20 NOTE — Progress Notes (Signed)
Subjective: Postpartum Day 1: Cesarean Delivery Patient reports tolerating PO.  Had Stage 2 PPH yesterday - responded well to Pitocin, Methergine and Hemabate and removal of clots from uterus. Patient's bleeding is now scant. She is not feeling dizzy or light headed. Feels better today.  Objective: Vital signs in last 24 hours: Temp:  [97.3 F (36.3 C)-98.9 F (37.2 C)] 98 F (36.7 C) (09/27 0700) Pulse Rate:  [45-95] 87 (09/27 0337) Resp:  [16-24] 18 (09/27 0337) BP: (80-138)/(38-83) 103/57 mmHg (09/27 0337) SpO2:  [96 %-100 %] 97 % (09/27 0403) Weight:  [62.596 kg (138 lb)] 62.596 kg (138 lb) (09/26 1700)  Physical Exam:  General: alert, cooperative and appears stated age Lochia: appropriate Uterine Fundus: firm Incision: healing well, no significant drainage, no dehiscence, no significant erythema DVT Evaluation: No evidence of DVT seen on physical exam.   Recent Labs  11/19/12 1759 11/20/12 0555  HGB 7.8* 5.7*  HCT 21.7* 16.2*    Assessment/Plan: Status post Cesarean section. Doing well postoperatively.  Continue current care Anemia - at this point I do not think she needs a blood transfusion but she may if she develops symptoms when she starts ambulating.  She wants to wait on a blood transfusion as well. I will give her Feraheme injection today to boost iron levels - discussed with patient  [name] 11/20/2012, 7:24 AM

## 2012-11-21 LAB — CBC
HCT: 11.8 % — ABNORMAL LOW (ref 36.0–46.0)
MCH: 31.5 pg (ref 26.0–34.0)
MCHC: 34.7 g/dL (ref 30.0–36.0)
MCV: 90.8 fL (ref 78.0–100.0)
Platelets: 142 10*3/uL — ABNORMAL LOW (ref 150–400)
RDW: 13.7 % (ref 11.5–15.5)
WBC: 13.1 10*3/uL — ABNORMAL HIGH (ref 4.0–10.5)

## 2012-11-21 NOTE — Progress Notes (Signed)
Subjective: Postpartum Day 2: Cesarean Delivery Patient reports incisional pain, tolerating PO, + flatus and no problems voiding.  Patient received 1 dose of Feraheme yesterday am and now feels much better. Her hemoglobin is 4.1 this am. However, she is now ambulating to the bathroom and does not feel dizzy or lightheaded. Her bleeding is scant. Pain is minimal.  Objective: Vital signs in last 24 hours: Temp:  [97.6 F (36.4 C)-100 F (37.8 C)] 97.6 F (36.4 C) (09/28 0538) Pulse Rate:  [90-123] 90 (09/28 0538) Resp:  [18] 18 (09/28 0538) BP: (104-125)/(61-70) 104/61 mmHg (09/28 0538) SpO2:  [95 %-96 %] 95 % (09/27 1250)  Physical Exam:  General: alert, cooperative and appears stated age Lochia: appropriate Uterine Fundus: firm Incision: healing well, no significant drainage, no dehiscence DVT Evaluation: No evidence of DVT seen on physical exam.   Recent Labs  11/20/12 0555 11/21/12 0602  HGB 5.7* 4.1*  HCT 16.2* 11.8*    Assessment/Plan: Status post Cesarean section. Doing well postoperatively.  Continue current care Discussed with her husband and the patient the severe anemia - she is currently feeling better and prefers not to have a blood transfusion. I do think she feels better after the feraheme. We discussed iron supplementation when she goes home. Her husband and her mother will be assisting with care of the children for the next 2 weeks. Probable discharge home tomorrow. Also, I do think this patient should have a hematologic work up after she recovers. She has a history of significant post partum hemorrhage after each one of her deliveries.   Tyrees Chopin L 11/21/2012, 7:22 AM

## 2012-11-21 NOTE — Lactation Note (Signed)
This note was copied from the chart of Amber Odelle Kosier. Lactation Consultation Note Nursery nurse Joni Reining informed LC of baby spitting up blood. Dr. Roxy Cedar notified by phone. LC discussed with nursery RNs possible rusty pipe syndrome up to 1 week post partum. RN Fannie Knee informed LC that mom is colloidal silver for treatment of sore nipples. Nipple trauma may also be a cause of baby spitting blood.  [facility]. Mom informed nurse that she used this preparation with last baby per her private LC recommendation.   Patient Name: [name] [id number] Date: 11/21/2012     Maternal Data    Feeding Feeding Type: Breast Milk Length of feed: 20 min  LATCH Score/Interventions                      Lactation Tools Discussed/Used     Consult Status      [name], Arvella Merles 11/21/2012, 8:17 PM

## 2012-11-22 LAB — TYPE AND SCREEN
ABO/RH(D): O POS
Unit division: 0
Unit division: 0
Unit division: 0
Unit division: 0
Unit division: 0
Unit division: 0

## 2012-11-22 LAB — CBC
HCT: 14.8 % — ABNORMAL LOW (ref 36.0–46.0)
Hemoglobin: 5.2 g/dL — CL (ref 12.0–15.0)
MCH: 32.3 pg (ref 26.0–34.0)
MCHC: 35.1 g/dL (ref 30.0–36.0)
RBC: 1.61 MIL/uL — ABNORMAL LOW (ref 3.87–5.11)
RDW: 13.9 % (ref 11.5–15.5)

## 2012-11-22 MED ORDER — IBUPROFEN 600 MG PO TABS: 600.0000 mg | ORAL_TABLET | Freq: Four times a day (QID) | ORAL | Status: DC

## 2012-11-22 MED ORDER — OXYCODONE-ACETAMINOPHEN 5-325 MG PO TABS: 1.0000 | ORAL_TABLET | ORAL | Status: DC | PRN

## 2012-11-22 NOTE — Discharge Summary (Signed)
Obstetric Discharge Summary Reason for Admission: cesarean section Prenatal Procedures: ultrasound Intrapartum Procedures: cesarean: low cervical, transverse Postpartum Procedures: IV iron Complications-Operative and Postpartum: hemorrhage Hemoglobin  Date Value Range Status  11/22/2012 5.2* 12.0 - 15.0 g/dL Final     DELTA CHECK NOTED     REPEATED TO VERIFY     CRITICAL RESULT CALLED TO, READ BACK BY AND VERIFIED WITH:     ESKER, D. @ 0915 ON 11/22/2012 BY [email or web address]     HCT  Date Value Range Status  11/22/2012 14.8* 36.0 - 46.0 % Final    Physical Exam:  General: alert and cooperative Lochia: appropriate Uterine Fundus: firm Incision: healing well, no significant drainage DVT Evaluation: No evidence of DVT seen on physical exam.  Discharge Diagnoses: Term Pregnancy-delivered  Discharge Information: Date: 11/22/2012 Activity: pelvic rest Diet: routine Medications: PNV, Ibuprofen, Iron and Percocet Condition: stable Instructions: refer to practice specific booklet Discharge to: home Follow-up Information   Schedule an appointment as soon as possible for a visit in 1 week to follow up.      Newborn Data: Live born female  Birth Weight: 5 lb (2268 g) APGAR: 8, 9  Home with mother.  Braya Habermehl 11/22/2012, 9:35 AM

## 2012-11-23 ENCOUNTER — Encounter (HOSPITAL_COMMUNITY): Payer: Self-pay | Admitting: Obstetrics and Gynecology

## 2012-11-24 ENCOUNTER — Other Ambulatory Visit (HOSPITAL_COMMUNITY): Payer: Managed Care, Other (non HMO)

## 2013-01-06 ENCOUNTER — Encounter (HOSPITAL_COMMUNITY): Payer: Self-pay | Admitting: *Deleted

## 2013-01-06 ENCOUNTER — Inpatient Hospital Stay (HOSPITAL_COMMUNITY): Payer: Managed Care, Other (non HMO) | Admitting: Anesthesiology

## 2013-01-06 ENCOUNTER — Inpatient Hospital Stay (HOSPITAL_COMMUNITY)
Admission: AD | Admit: 2013-01-06 | Discharge: 2013-01-08 | DRG: 669 | Disposition: A | Discharge status: Home / Self Care | Payer: Managed Care, Other (non HMO) | Source: Ambulatory Visit | Attending: Internal Medicine | Admitting: Internal Medicine

## 2013-01-06 ENCOUNTER — Other Ambulatory Visit: Payer: Self-pay | Admitting: Urology

## 2013-01-06 ENCOUNTER — Inpatient Hospital Stay (HOSPITAL_COMMUNITY): Payer: Managed Care, Other (non HMO)

## 2013-01-06 ENCOUNTER — Encounter (HOSPITAL_COMMUNITY)
Admission: AD | Disposition: A | Discharge status: Home / Self Care | Payer: Self-pay | Source: Ambulatory Visit | Attending: Internal Medicine

## 2013-01-06 ENCOUNTER — Encounter (HOSPITAL_COMMUNITY): Payer: Managed Care, Other (non HMO) | Admitting: Anesthesiology

## 2013-01-06 DIAGNOSIS — F410 Panic disorder [episodic paroxysmal anxiety] without agoraphobia: Secondary | ICD-10-CM

## 2013-01-06 DIAGNOSIS — N390 Urinary tract infection, site not specified: Secondary | ICD-10-CM

## 2013-01-06 DIAGNOSIS — J309 Allergic rhinitis, unspecified: Secondary | ICD-10-CM

## 2013-01-06 DIAGNOSIS — N943 Premenstrual tension syndrome: Secondary | ICD-10-CM

## 2013-01-06 DIAGNOSIS — D696 Thrombocytopenia, unspecified: Secondary | ICD-10-CM | POA: Diagnosis present

## 2013-01-06 DIAGNOSIS — D62 Acute posthemorrhagic anemia: Secondary | ICD-10-CM | POA: Diagnosis not present

## 2013-01-06 DIAGNOSIS — N201 Calculus of ureter: Secondary | ICD-10-CM

## 2013-01-06 DIAGNOSIS — Z791 Long term (current) use of non-steroidal anti-inflammatories (NSAID): Secondary | ICD-10-CM

## 2013-01-06 DIAGNOSIS — Z79899 Other long term (current) drug therapy: Secondary | ICD-10-CM

## 2013-01-06 DIAGNOSIS — N179 Acute kidney failure, unspecified: Secondary | ICD-10-CM

## 2013-01-06 DIAGNOSIS — D72829 Elevated white blood cell count, unspecified: Secondary | ICD-10-CM

## 2013-01-06 DIAGNOSIS — Z9889 Other specified postprocedural states: Secondary | ICD-10-CM

## 2013-01-06 DIAGNOSIS — N139 Obstructive and reflux uropathy, unspecified: Secondary | ICD-10-CM | POA: Diagnosis present

## 2013-01-06 HISTORY — PX: CYSTOSCOPY WITH RETROGRADE PYELOGRAM, URETEROSCOPY AND STENT PLACEMENT: SHX5789

## 2013-01-06 LAB — CBC
Hemoglobin: 12.1 g/dL (ref 12.0–15.0)
MCH: 31.1 pg (ref 26.0–34.0)
MCV: 89.5 fL (ref 78.0–100.0)
Platelets: 121 10*3/uL — ABNORMAL LOW (ref 150–400)
RDW: 13.3 % (ref 11.5–15.5)
WBC: 25.2 10*3/uL — ABNORMAL HIGH (ref 4.0–10.5)

## 2013-01-06 LAB — URINALYSIS, ROUTINE W REFLEX MICROSCOPIC
Glucose, UA: NEGATIVE mg/dL
Protein, ur: 100 mg/dL — AB
Specific Gravity, Urine: 1.015 (ref 1.005–1.030)
Urobilinogen, UA: 0.2 mg/dL (ref 0.0–1.0)
pH: 6 (ref 5.0–8.0)

## 2013-01-06 LAB — COMPREHENSIVE METABOLIC PANEL
ALT: 16 U/L (ref 0–35)
AST: 25 U/L (ref 0–37)
Albumin: 2.5 g/dL — ABNORMAL LOW (ref 3.5–5.2)
Calcium: 9 mg/dL (ref 8.4–10.5)
Creatinine, Ser: 3.05 mg/dL — ABNORMAL HIGH (ref 0.50–1.10)
GFR calc non Af Amer: 18 mL/min — ABNORMAL LOW (ref >90)
Sodium: 134 mEq/L — ABNORMAL LOW (ref 135–145)

## 2013-01-06 LAB — URINE MICROSCOPIC-ADD ON

## 2013-01-06 SURGERY — CYSTOURETEROSCOPY, WITH RETROGRADE PYELOGRAM AND STENT INSERTION
Anesthesia: General | Laterality: Left | Wound class: Clean Contaminated

## 2013-01-06 MED ORDER — HYOSCYAMINE SULFATE 0.125 MG SL SUBL
0.1250 mg | SUBLINGUAL_TABLET | SUBLINGUAL | Status: DC | PRN
  Filled 2013-01-06: qty 1

## 2013-01-06 MED ORDER — LACTATED RINGERS IV SOLN: INTRAVENOUS | Status: DC

## 2013-01-06 MED ORDER — ONDANSETRON 8 MG/NS 50 ML IVPB
8.0000 mg | Freq: Three times a day (TID) | INTRAVENOUS | Status: DC | PRN
  Administered 2013-01-06: 8 mg via INTRAVENOUS
  Filled 2013-01-06: qty 8

## 2013-01-06 MED ORDER — DEXTROSE 5 % IV SOLN
1.0000 g | INTRAVENOUS | Status: DC
  Administered 2013-01-07: 15:00:00 1 g via INTRAVENOUS
  Filled 2013-01-06 (×2): qty 10

## 2013-01-06 MED ORDER — IOHEXOL 300 MG/ML  SOLN
INTRAMUSCULAR | Status: DC | PRN
  Administered 2013-01-06: 10 mL via URETHRAL

## 2013-01-06 MED ORDER — LIDOCAINE HCL (CARDIAC) 20 MG/ML IV SOLN
INTRAVENOUS | Status: DC | PRN
  Administered 2013-01-06: 50 mg via INTRAVENOUS

## 2013-01-06 MED ORDER — DEXAMETHASONE SODIUM PHOSPHATE 10 MG/ML IJ SOLN
INTRAMUSCULAR | Status: DC | PRN
  Administered 2013-01-06: 5 mg via INTRAVENOUS

## 2013-01-06 MED ORDER — ONDANSETRON HCL 4 MG/2ML IJ SOLN
INTRAMUSCULAR | Status: DC | PRN
  Administered 2013-01-06: 4 mg via INTRAVENOUS

## 2013-01-06 MED ORDER — PHENAZOPYRIDINE HCL 100 MG PO TABS
100.0000 mg | ORAL_TABLET | Freq: Two times a day (BID) | ORAL | Status: DC | PRN
  Filled 2013-01-06: qty 1

## 2013-01-06 MED ORDER — PROPOFOL 10 MG/ML IV BOLUS
INTRAVENOUS | Status: DC | PRN
  Administered 2013-01-06: 130 mg via INTRAVENOUS

## 2013-01-06 MED ORDER — ACETAMINOPHEN 325 MG PO TABS: 650.0000 mg | ORAL_TABLET | ORAL | Status: DC | PRN

## 2013-01-06 MED ORDER — FENTANYL CITRATE 0.05 MG/ML IJ SOLN
25.0000 ug | INTRAMUSCULAR | Status: DC | PRN
  Administered 2013-01-06 (×2): 25 ug via INTRAVENOUS

## 2013-01-06 MED ORDER — OXYCODONE HCL 5 MG PO TABS
5.0000 mg | ORAL_TABLET | ORAL | Status: DC | PRN
  Administered 2013-01-08: 5 mg via ORAL
  Filled 2013-01-06: qty 1

## 2013-01-06 MED ORDER — LACTATED RINGERS IV SOLN
INTRAVENOUS | Status: DC | PRN
  Administered 2013-01-06: 15:00:00 via INTRAVENOUS

## 2013-01-06 MED ORDER — HYDROMORPHONE HCL PF 1 MG/ML IJ SOLN
0.5000 mg | Freq: Once | INTRAMUSCULAR | Status: AC
  Administered 2013-01-06: 0.5 mg via INTRAVENOUS
  Filled 2013-01-06: qty 1

## 2013-01-06 MED ORDER — LACTATED RINGERS IV BOLUS (SEPSIS)
1000.0000 mL | Freq: Once | INTRAVENOUS | Status: AC
  Administered 2013-01-06: 1000 mL via INTRAVENOUS

## 2013-01-06 MED ORDER — ONDANSETRON HCL 4 MG/2ML IJ SOLN: 4.0000 mg | INTRAMUSCULAR | Status: DC | PRN

## 2013-01-06 MED ORDER — SODIUM CHLORIDE 0.9 % IR SOLN
Status: DC | PRN
  Administered 2013-01-06: 4000 mL

## 2013-01-06 MED ORDER — HYDROMORPHONE HCL PF 1 MG/ML IJ SOLN: 0.5000 mg | INTRAMUSCULAR | Status: DC | PRN

## 2013-01-06 MED ORDER — PANTOPRAZOLE SODIUM 40 MG IV SOLR
40.0000 mg | Freq: Once | INTRAVENOUS | Status: AC
  Administered 2013-01-06: 40 mg via INTRAVENOUS
  Filled 2013-01-06: qty 40

## 2013-01-06 MED ORDER — CEFTRIAXONE SODIUM 1 G IJ SOLR
1.0000 g | Freq: Once | INTRAMUSCULAR | Status: AC
  Administered 2013-01-06: 1 g via INTRAVENOUS
  Filled 2013-01-06: qty 10

## 2013-01-06 MED ORDER — 0.9 % SODIUM CHLORIDE (POUR BTL) OPTIME
TOPICAL | Status: DC | PRN
  Administered 2013-01-06: 1000 mL

## 2013-01-06 MED ORDER — ZOLPIDEM TARTRATE 5 MG PO TABS: 5.0000 mg | ORAL_TABLET | Freq: Every evening | ORAL | Status: DC | PRN

## 2013-01-06 MED ORDER — INFLUENZA VAC SPLIT QUAD 0.5 ML IM SUSP
0.5000 mL | INTRAMUSCULAR | Status: DC
  Filled 2013-01-06 (×2): qty 0.5

## 2013-01-06 MED ORDER — FENTANYL CITRATE 0.05 MG/ML IJ SOLN
INTRAMUSCULAR | Status: DC | PRN
  Administered 2013-01-06 (×2): 25 ug via INTRAVENOUS

## 2013-01-06 MED ORDER — IOHEXOL 300 MG/ML  SOLN
50.0000 mL | INTRAMUSCULAR | Status: AC
  Administered 2013-01-06: 50 mL via ORAL

## 2013-01-06 MED ORDER — LACTATED RINGERS IV SOLN
INTRAVENOUS | Status: DC
  Administered 2013-01-06: 11:00:00 via INTRAVENOUS

## 2013-01-06 MED ORDER — SODIUM CHLORIDE 0.9 % IV SOLN
INTRAVENOUS | Status: DC
  Administered 2013-01-07: via INTRAVENOUS

## 2013-01-06 MED ORDER — FENTANYL CITRATE 0.05 MG/ML IJ SOLN
INTRAMUSCULAR | Status: AC
  Filled 2013-01-06: qty 2

## 2013-01-06 MED ORDER — MIDAZOLAM HCL 5 MG/5ML IJ SOLN
INTRAMUSCULAR | Status: DC | PRN
  Administered 2013-01-06: 1 mg via INTRAVENOUS

## 2013-01-06 SURGICAL SUPPLY — 14 items
BAG URO CATCHER STRL LF (DRAPE) ×2 IMPLANT
BASKET ZERO TIP NITINOL 2.4FR (BASKET) IMPLANT
CATH INTERMIT  6FR 70CM (CATHETERS) ×2 IMPLANT
DRAPE CAMERA CLOSED 9X96 (DRAPES) ×2 IMPLANT
FIBER LASER FLEXIVA 200 (UROLOGICAL SUPPLIES) ×2 IMPLANT
GLOVE BIOGEL M 8.0 STRL (GLOVE) ×2 IMPLANT
GOWN PREVENTION PLUS XLARGE (GOWN DISPOSABLE) ×2 IMPLANT
GOWN STRL REIN XL XLG (GOWN DISPOSABLE) ×2 IMPLANT
GUIDEWIRE ANG ZIPWIRE 038X150 (WIRE) IMPLANT
GUIDEWIRE STR DUAL SENSOR (WIRE) ×2 IMPLANT
MANIFOLD NEPTUNE II (INSTRUMENTS) ×2 IMPLANT
PACK CYSTO (CUSTOM PROCEDURE TRAY) ×2 IMPLANT
STENT PERCUFLEX 4.8FRX24 (STENTS) ×2 IMPLANT
TUBING CONNECTING 10 (TUBING) ×2 IMPLANT

## 2013-01-06 NOTE — Consult Note (Signed)
Urology Consult  CC: Referring physician: Dr. Esperanza Richters Reason for referral: Left UPJ stone with elevated creatinine.  History of Present Illness: [name] is a 37 year old female who underwent an uneventful C-section about 6-1/2 weeks ago.  She was admitted to [facility] with a history of epigastric pain and associated nausea and vomiting.That began approximately 3 days ago and she then began having diarrhea yesterday.  She did not been taking good p.o. Intake for the past 36 hours.  Her evaluation included a CT scan which revealed a nonobstructing stone within the left kidney in the lower pole and also a 6 mm stone located at the UPJ on the left-hand side.  Her right kidney appears normal but the left kidney appears possibly enlarged with some perinephric stranding.  Her creatinine is 3.05 with a BUN of 64.  Her white blood cell count is 25.2.  She does complain of epigastric pain but also is complaining of some left flank pain.  She does not complain of voiding symptoms.  Past Medical History  Diagnosis Date  . Medical history non-contributory   . Anxiety    Past Surgical History  Procedure Laterality Date  . Childbirth      x 4  . Pilonidal cyst excision    . Dilation and curettage of uterus    . Cesarean section N/A 11/19/2012    Procedure: PRIMARY CESAREAN SECTION;  Surgeon: Jeani Hawking, MD;  Location: [facility];  Service: Obstetrics;  Laterality: N/A;  Primary edc 10/16    Medications:  Prior to Admission:  Prescriptions prior to admission  Medication Sig Dispense Refill  . Acetaminophen (TYLENOL PO) Take 1 tablet by mouth every 8 (eight) hours as needed (takes for pain).      . citalopram (CELEXA) 10 MG tablet Take 10 mg by mouth daily.      . fluconazole (DIFLUCAN) 150 MG tablet Take 150 mg by mouth daily.      Marland Kitchen ibuprofen (ADVIL,MOTRIN) 600 MG tablet Take 1 tablet (600 mg total) by mouth every 6 (six) hours.  30 tablet  0   Scheduled:  Continuous: .  lactated ringers 125 mL/hr at 01/06/13 1047    Allergies: No Known Allergies  Family History  Problem Relation Age of Onset  . Alcohol abuse Neg Hx   . Arthritis Neg Hx   . Asthma Neg Hx   . Birth defects Neg Hx   . Cancer Neg Hx   . COPD Neg Hx   . Depression Neg Hx   . Diabetes Neg Hx   . Drug abuse Neg Hx   . Early death Neg Hx   . Hearing loss Neg Hx   . Heart disease Neg Hx   . Hyperlipidemia Neg Hx   . Hypertension Neg Hx   . Kidney disease Neg Hx   . Learning disabilities Neg Hx   . Mental illness Neg Hx   . Mental retardation Neg Hx   . Miscarriages / Stillbirths Neg Hx   . Stroke Neg Hx   . Vision loss Neg Hx   . Varicose Veins Neg Hx     Social History:  reports that she has never smoked. She has never used smokeless tobacco. She reports that she drinks alcohol. Her drug history is not on file.  Review of Systems: Pertinent items are noted in HPI. A comprehensive review of systems was negative except as noted above  Physical Exam:  Vital signs in last 24 hours: Temp:  [97.5  F (36.4 C)-97.8 F (36.6 C)] 97.5 F (36.4 C) (11/13 1051) Pulse Rate:  [110-129] 115 (11/13 1318) Resp:  [16-18] 18 (11/13 1318) BP: (101-124)/(73-83) 118/79 mmHg (11/13 1318) SpO2:  [100 %] 100 % (11/13 0813) Weight:  [52.617 kg (116 lb)] 52.617 kg (116 lb) (11/13 0813) General appearance: alert and appears stated age Head: Normocephalic, without obvious abnormality, atraumatic Eyes: conjunctivae/corneas clear. EOM's intact.  Oropharynx: moist mucous membranes Neck: supple, symmetrical, trachea midline Resp: normal respiratory effort Cardio: regular rate and rhythm Back: symmetric, no curvature. ROM normal. Left;RightNot not initially no not initially initially needs to happenAnd may completely resolve this for 40 CVA tenderness. GI: soft, non-tender; bowel sounds normal; no masses,  no organomegaly Extremities: extremities normal, atraumatic, no cyanosis or edema Skin: Skin  color normal. No visible rashes or lesions Neurologic: Grossly normal  Laboratory Data:   Recent Labs  01/06/13 0802  WBC 25.2*  HGB 12.1  HCT 34.8*   BMET  Recent Labs  01/06/13 0802  NA 134*  K 4.5  CL 94*  CO2 16*  GLUCOSE 54*  BUN 64*  CREATININE 3.05*  CALCIUM 9.0   No results found for this basename: LABPT, INR,  in the last 72 hours No results found for this basename: LABURIN,  in the last 72 hours No results found for this or any previous visit. Creatinine:  Recent Labs  01/06/13 0802  CREATININE 3.05*    Imaging: Ct Abdomen Pelvis Wo Contrast  01/06/2013   CLINICAL DATA:  37 year old female 6 weeks postpartum status post C-section. Epigastric pain with vomiting and diarrhea. Pain greater on the left. Renal insufficiency. Initial encounter.  EXAM: CT ABDOMEN AND PELVIS WITHOUT CONTRAST  TECHNIQUE: Multidetector CT imaging of the abdomen and pelvis was performed following the standard protocol without intravenous contrast.  COMPARISON:  Pelvis ultrasound 08/31/2007.  FINDINGS: Mild lung base atelectasis.  No pericardial or pleural effusion.  Scoliosis. No acute osseous abnormality identified.  Small to moderate volume of simple appearing pelvic free fluid (series 2, image 67). Negative rectum. Negative non contrast uterus and adnexal.  Fluid in the distal colon. Decompressed left colon. Oral contrast has reached the proximal transverse colon. Redundant hepatic flexure and cecum on a lax mesenteries such that the cecum is located in the right upper quadrant. Normal appendix (coronal image 23). No dilated small bowel. Negative stomach and duodenum.  Mild periportal edema, otherwise negative non contrast appearance of the liver. Suggestion of gallbladder sludge. Suggestion of mild pericholecystic fluid or mild gallbladder wall thickening. Still, no pericholecystic stranding.  Negative non contrast spleen, pancreas and adrenal glands.  Normal non contrast right kidney and  ureter.  Unremarkable bladder.  Enlarged and indistinct left kidney with mild perinephric stranding. There is a 6 x 5 x 9 mm calculus lodged at the left ureteropelvic junction (coronal image 59). There is a smaller nonobstructing calculus in the left lower pole. Beyond the UPJ calculus, the left ureter is diminutive. The left gonadal vein does appear mildly prominent. No additional left ureteral calculus identified.  No abdominal free fluid.  No free air.  IMPRESSION: 1. Acute obstructive uropathy on the left with a 6 x 5 x 9 mm calculus (AP by transverse by CC) lodged at the left UPJ. Enlarged and indistinct appearing left kidney.  2. No other acute process identified in the abdomen. Small to moderate nonspecific simple appearing pelvic free fluid, favor reactive.  3. Suspect mild periportal edema and trace pericholecystic fluid, may be reactive in  this setting. Subtle evidence of gallbladder sludge.  4. Incidentally, the cecum is on a lax mesenteries and located in the right upper quadrant as is the appendix which otherwise appears normal.   Electronically Signed   By: Augusto Gamble M.D.   On: 01/06/2013 11:35    Impression/Assessment:  After reviewing the patient's CT scan, her labs and speaking with Dr. Vickey Sages I think the best thing for this patient at this time would be the placement of a left double-J stent.  The reason I feel that this would be helpful is that she has what appears to be an obstructing left ureteral stone which may be causing her nausea and vomiting as well as flank pain.  I think this should be performed urgently because of several factors including her elevated creatinine, her elevated white blood cell count, what appears to be some left renal perinephric stranding and edema of the kidney as well as a urinalysis that had many white cells present.  She is not febrile or tachycardic nor does she have hypotension but she does have significant abnormalities of her electrolytes.  My hope is that  the placement of her stent, in addition to relieving possible obstruction with possibly infected urine, would also result in resolution of her flank pain, nausea, vomiting and allow better oral intake and normalization of her blood work.  I discussed the procedure of cystoscopy with retrograde pyelogram and stent placement with her in detail. We discussed the risks and complications, alternatives, the anticipated probability of success as well as the anticipated postoperative course.   Once the stent is in place and she has improved medically her stone, which has Hounsfield units of approximately 900, could be managed with either ureteroscopy or lithotripsy.  Plan:  Cystoscopy with left retrograde pyelogram and double-J stent placement.  Denzal Meir C 01/06/2013, 1:27 PM

## 2013-01-06 NOTE — Progress Notes (Signed)
Pt continues to feel bad, now complaining of some left flank pain.  CT scan reviewed showing L kidney enlargement and 9mm Left ureteral stone.  Dr Alfredia Ferguson consulted and accepts pt in transfer to [facility] for stent placement.  Plan of care discussed with pt along with need for admission after the procedure for continued care.  Pt agrees with plan of care.

## 2013-01-06 NOTE — H&P (Signed)
[facility] History and Physical  [name] BJY:[id number] DOB: [birth year]    PCP:   Evette Georges, MD   Chief Complaint:  Nausea and vomiting.  Found to be in acute renal failure from obtructive ureteral stone.  HPI: [name] is an 37 y.o. female s/p C section about 6 weeks ago, presents to [facility] hospital with hx of nausea, vomiting and diarrhea, found to have AKI with Cr of 3, and left obtructive ureteral stone.  She was seen in consultation with urologist Dr Teodoro Spray, and was taken to the OR for a ureteral stent.  She also found to have a UTI, and was placed on Rocephin IV.  Dr Leonette Most called me tonight and asked that I take over her care.  She felt better, having less nausea and vomiting, and having no abdominal discomfort.  I noted that she did have a leukocytosis with a WBC of 25K.  She remained hemodynamically stable.    Rewiew of Systems:  Constitutional: Negative for dysuria.  No significant weight loss or weight gain Eyes: Negative for eye pain, redness and discharge, diplopia, visual changes, or flashes of light. ENMT: Negative for ear pain, hoarseness, nasal congestion, sinus pressure and sore throat. No headaches; tinnitus, drooling, or problem swallowing. Cardiovascular: Negative for chest pain, palpitations, diaphoresis, dyspnea and peripheral edema. ; No orthopnea, PND Respiratory: Negative for cough, hemoptysis, wheezing and stridor. No pleuritic chestpain. Gastrointestinal: Negative for nausea, vomiting, diarrhea, constipation, abdominal pain, melena, blood in stool, hematemesis, jaundice and rectal bleeding.    Genitourinary: Negative for frequency, dysuria, incontinence,flank pain and hematuria; Musculoskeletal: Negative for back pain and neck pain. Negative for swelling and trauma.;  Skin: . Negative for pruritus, rash, abrasions, bruising and skin lesion.; ulcerations Neuro: Negative for headache, lightheadedness and neck stiffness. Negative for weakness,  altered level of consciousness , altered mental status, extremity weakness, burning feet, involuntary movement, seizure and syncope.  Psych: negative for anxiety, depression, insomnia, tearfulness, panic attacks, hallucinations, paranoia, suicidal or homicidal ideation    Past Medical History  Diagnosis Date  . Medical history non-contributory   . Anxiety     Past Surgical History  Procedure Laterality Date  . Childbirth      x 4  . Pilonidal cyst excision    . Dilation and curettage of uterus    . Cesarean section N/A 11/19/2012    Procedure: PRIMARY CESAREAN SECTION;  Surgeon: Jeani Hawking, MD;  Location: [facility];  Service: Obstetrics;  Laterality: N/A;  Primary edc 10/16    Medications:  HOME MEDS: Prior to Admission medications   Medication Sig Start Date End Date Taking? Authorizing Provider  Acetaminophen (TYLENOL PO) Take 1 tablet by mouth every 8 (eight) hours as needed (takes for pain).   Yes Historical Provider, MD  citalopram (CELEXA) 10 MG tablet Take 10 mg by mouth daily.   Yes Historical Provider, MD  fluconazole (DIFLUCAN) 150 MG tablet Take 150 mg by mouth daily.   Yes Historical Provider, MD  ibuprofen (ADVIL,MOTRIN) 600 MG tablet Take 1 tablet (600 mg total) by mouth every 6 (six) hours. 11/22/12  Yes Zelphia Cairo, MD     Allergies:  No Known Allergies  Social History:   reports that she has never smoked. She has never used smokeless tobacco. She reports that she drinks alcohol. Her drug history is not on file.  Family History: Family History  Problem Relation Age of Onset  . Alcohol abuse Neg Hx   . Arthritis Neg Hx   .  Asthma Neg Hx   . Birth defects Neg Hx   . Cancer Neg Hx   . COPD Neg Hx   . Depression Neg Hx   . Diabetes Neg Hx   . Drug abuse Neg Hx   . Early death Neg Hx   . Hearing loss Neg Hx   . Heart disease Neg Hx   . Hyperlipidemia Neg Hx   . Hypertension Neg Hx   . Kidney disease Neg Hx   . Learning disabilities Neg Hx   .  Mental illness Neg Hx   . Mental retardation Neg Hx   . Miscarriages / Stillbirths Neg Hx   . Stroke Neg Hx   . Vision loss Neg Hx   . Varicose Veins Neg Hx      Physical Exam: Filed Vitals:   01/06/13 1730 01/06/13 1748 01/06/13 1825 01/06/13 2100  BP: 106/65 110/70 110/70 120/75  Pulse: 117 120 117 125  Temp: 98.5 F (36.9 C) 98.3 F (36.8 C) 98.3 F (36.8 C) 98.4 F (36.9 C)  TempSrc:   Oral Oral  Resp: 14 15  14   Height:   5' 1.5" (1.562 m)   Weight:   52.617 kg (116 lb)   SpO2: 100% 100% 100% 98%   Blood pressure 120/75, pulse 125, temperature 98.4 F (36.9 C), temperature source Oral, resp. rate 14, height 5' 1.5" (1.562 m), weight 52.617 kg (116 lb), SpO2 98.00%.  GEN:  Pleasant  patient lying in the stretcher in no acute distress; cooperative with exam. PSYCH:  alert and oriented x4; does not appear anxious or depressed; affect is appropriate. HEENT: Mucous membranes pink and anicteric; PERRLA; EOM intact; no cervical lymphadenopathy nor thyromegaly or carotid bruit; no JVD; There were no stridor. Neck is very supple. Breasts:: Not examined CHEST WALL: No tenderness CHEST: Normal respiration, clear to auscultation bilaterally.  HEART: Regular rate and rhythm.  There are no murmur, rub, or gallops.   BACK: No kyphosis or scoliosis; no CVA tenderness ABDOMEN: soft and non-tender; no masses, no organomegaly, normal abdominal bowel sounds; no pannus; no intertriginous candida. There is no rebound and no distention. Rectal Exam: Not done EXTREMITIES: No bone or joint deformity; age-appropriate arthropathy of the hands and knees; no edema; no ulcerations.  There is no calf tenderness. Genitalia: not examined.  Her C section scar is clean, no tender or erythema. PULSES: 2+ and symmetric SKIN: Normal hydration no rash or ulceration CNS: Cranial nerves 2-12 grossly intact no focal lateralizing neurologic deficit.  Speech is fluent; uvula elevated with phonation, facial  symmetry and tongue midline. DTR are normal bilaterally, cerebella exam is intact, barbinski is negative and strengths are equaled bilaterally.  No sensory loss.   Labs on Admission:  Basic Metabolic Panel:  Recent Labs Lab 01/06/13 0802  NA 134*  K 4.5  CL 94*  CO2 16*  GLUCOSE 54*  BUN 64*  CREATININE 3.05*  CALCIUM 9.0   Liver Function Tests:  Recent Labs Lab 01/06/13 0802  AST 25  ALT 16  ALKPHOS 232*  BILITOT 0.3  PROT 6.2  ALBUMIN 2.5*   No results found for this basename: LIPASE, AMYLASE,  in the last 168 hours No results found for this basename: AMMONIA,  in the last 168 hours CBC:  Recent Labs Lab 01/06/13 0802  WBC 25.2*  HGB 12.1  HCT 34.8*  MCV 89.5  PLT 121*   Cardiac Enzymes: No results found for this basename: CKTOTAL, CKMB, CKMBINDEX, TROPONINI,  in the  last 168 hours  CBG: No results found for this basename: GLUCAP,  in the last 168 hours   Radiological Exams on Admission: Ct Abdomen Pelvis Wo Contrast  01/06/2013   CLINICAL DATA:  37 year old female 6 weeks postpartum status post C-section. Epigastric pain with vomiting and diarrhea. Pain greater on the left. Renal insufficiency. Initial encounter.  EXAM: CT ABDOMEN AND PELVIS WITHOUT CONTRAST  TECHNIQUE: Multidetector CT imaging of the abdomen and pelvis was performed following the standard protocol without intravenous contrast.  COMPARISON:  Pelvis ultrasound 08/31/2007.  FINDINGS: Mild lung base atelectasis.  No pericardial or pleural effusion.  Scoliosis. No acute osseous abnormality identified.  Small to moderate volume of simple appearing pelvic free fluid (series 2, image 67). Negative rectum. Negative non contrast uterus and adnexal.  Fluid in the distal colon. Decompressed left colon. Oral contrast has reached the proximal transverse colon. Redundant hepatic flexure and cecum on a lax mesenteries such that the cecum is located in the right upper quadrant. Normal appendix (coronal image  23). No dilated small bowel. Negative stomach and duodenum.  Mild periportal edema, otherwise negative non contrast appearance of the liver. Suggestion of gallbladder sludge. Suggestion of mild pericholecystic fluid or mild gallbladder wall thickening. Still, no pericholecystic stranding.  Negative non contrast spleen, pancreas and adrenal glands.  Normal non contrast right kidney and ureter.  Unremarkable bladder.  Enlarged and indistinct left kidney with mild perinephric stranding. There is a 6 x 5 x 9 mm calculus lodged at the left ureteropelvic junction (coronal image 59). There is a smaller nonobstructing calculus in the left lower pole. Beyond the UPJ calculus, the left ureter is diminutive. The left gonadal vein does appear mildly prominent. No additional left ureteral calculus identified.  No abdominal free fluid.  No free air.  IMPRESSION: 1. Acute obstructive uropathy on the left with a 6 x 5 x 9 mm calculus (AP by transverse by CC) lodged at the left UPJ. Enlarged and indistinct appearing left kidney.  2. No other acute process identified in the abdomen. Small to moderate nonspecific simple appearing pelvic free fluid, favor reactive.  3. Suspect mild periportal edema and trace pericholecystic fluid, may be reactive in this setting. Subtle evidence of gallbladder sludge.  4. Incidentally, the cecum is on a lax mesenteries and located in the right upper quadrant as is the appendix which otherwise appears normal.   Electronically Signed   By: Augusto Gamble M.D.   On: 01/06/2013 11:35   Assessment/Plan Present on Admission:  . Ureteral calculus, left S/p C section 6 weeks ago. AKI Sludge in the gallbladder UTI.  PLAN:  She is feeling better after the ureteral stent.  WIll continue with IV Rocephin and IVF.  Follow her Cr carefully, which I suspect will improve.  I don't think it is her gallbladder causing a problem, but will keep this in mind as well.  Her LFTs were OK.  Will check a lipase.  Her other  problems were stable.  Will assume care and transfer her to the medicine service as requested by Dr Vickey Sages.  Thank you for asking me to participate in her care.  Other plans as per orders.  Code Status: FULL Unk Lightning, MD. [facility] Pager [phone] 7pm to 7am.  01/06/2013, 10:23 PM

## 2013-01-06 NOTE — H&P (Signed)
37 yo G6P5 6 1/2 weeks s/p c-section presents with epigastric pain and diarrhea.  Pt reports her symptoms began 3 days ago as N/V.  Diarrhea began yesterday even though n/v have improved.  Pt reports decreased appetite and epigastric pain.  No f/c.  No blood in stool or emesis.  Diarrhea is watery.  Pt was able to tolerate PO yesterday.  Pt was given PO zofran and protonix yesterday without improvement of her symptoms.  Encouraged to come to MAU this am for further management and evaluation    PMHx:  Anxiety PSHx:  c-section (11/20/12), pilonidal cyst removed, D&C All:  None Meds:  PNV OBHx:  SVD x 4, c-section as above, MAB x 1  AF HR 129, BP 101/73, RR 16 Gen - NAD Abd - soft, ND tender at epigastrium.  No R/G Incision c/d/i Ext NT  A/P:  Persistent diarrhea and epigastric pain IVF IV protonix CBC/CMET CT abdomen

## 2013-01-06 NOTE — Anesthesia Postprocedure Evaluation (Signed)
  Anesthesia Post-op Note  Patient: [name]  Procedure(s) Performed: Procedure(s) (LRB): CYSTOSCOPY WITH LEFT RETROGRADE PYELOGRAM, LEFT STENT PLACEMENT, LEFT URETEROSCOPY, LASER LITHOTRIPSY (Left)  Patient Location: PACU  Anesthesia Type: General  Level of Consciousness: awake and alert   Airway and Oxygen Therapy: Patient Spontanous Breathing  Post-op Pain: mild  Post-op Assessment: Post-op Vital signs reviewed, Patient's Cardiovascular Status Stable, Respiratory Function Stable, Patent Airway and No signs of Nausea or vomiting  Last Vitals:  Filed Vitals:   01/06/13 1700  BP: 116/70  Pulse: 125  Temp: 36.9 C  Resp: 18    Post-op Vital Signs: stable   Complications: No apparent anesthesia complications

## 2013-01-06 NOTE — MAU Note (Signed)
Patient states she became sick on 11-9 with vomiting and has been having diarrhea. Feels weak. Some abdominal pressure. Delivered by cesarean section on 9=26.

## 2013-01-06 NOTE — Op Note (Signed)
PATIENT:  [name]  PRE-OPERATIVE DIAGNOSIS:  left Ureteral calculus  POST-OPERATIVE DIAGNOSIS: Same  PROCEDURE:  1. Cystoscopy with left retrograde pyelogram including interpretation. 2. Left ureteroscopy with laser lithotripsy. 3. Left double-J stent placement  SURGEON: Garnett Farm, MD  INDICATION: Mrs. long is a 37 year old female who was admitted to the hospital with flank pain, nausea, vomiting and had an elevated white count, an elevated creatinine and pyuria. She is brought to the operating room today for stent placement to unobstruct her kidneys.  ANESTHESIA:  General  EBL:  Minimal  DRAINS: 4.8 French, 24 cm double-J stent  SPECIMEN:  None  DESCRIPTION OF PROCEDURE: The patient was taken to the major OR and placed on the table. General anesthesia was administered and then the patient was moved to the dorsal lithotomy position. The genitalia was sterilely prepped and draped. An official timeout was performed.  Initially the 22 French cystoscope with 12 lens was passed under direct vision. The bladder was then entered and fully inspected. It was noted be free of any tumors stones or tumors. It did appear mildly, diffusely inflamed. Ureteral orifices were of normal configuration and position. A 6 French open-ended ureteral catheter was then passed through the cystoscope into the ureteral orifice in order to perform a left retrograde pyelogram.  A retrograde pyelogram was performed by injecting full-strength contrast up the left ureter under direct fluoroscopic control. It revealed a filling defect in the ureter consistent with the stone seen on the preoperative CT. The remainder of the ureter was noted to be normal however I was unable to see a significant amount of contrast get past the stone. My concern was that I did not want to use pressure. I therefore passed a 0.038 inch floppy-tipped guidewire through the open ended catheter and into the area of the stone and then passed  a guidewire beyond the stone but an attempt to pass the 6 [place] open-ended catheter over the guidewire and beyond the stone was unsuccessful. I made several attempts and then attempted to pass the 4.8 French double-J stent over the guidewire and beyond the stone but was also unsuccessful. I therefore felt ureteroscopy was indicated.  A 6 French rigid ureteroscope was then passed under direct into the bladder and into the left orifice and up the ureter. The stone was identified and I felt it was too large to extract and therefore elected to proceed with laser lithotripsy. The 200  holmium laser fiber was used to fragment the stone. Throughout the procedure I used very minimal pressure and did not attempt to fully fragment the stone or to extract the stone fragments as I felt just breaking the stone enough to pass a stent beyond it at this point was the most prudent to follow. I was able to advance the ureteroscope beyond the stone and into the area the renal pelvis. I then passed the guidewire through the ureteroscope into the area the renal pelvis and removed the ureteroscope leaving the guidewire in place. I then backloaded the cystoscope over the guidewire and passed the stent over the guidewire into the area of the renal pelvis. As the guidewire was removed good curl was noted in the renal pelvis. The bladder was drained and the cystoscope was then removed. The patient tolerated the procedure well no intraoperative complications.  PLAN OF CARE: Discharge to home after PACU  PATIENT DISPOSITION:  PACU - hemodynamically stable.

## 2013-01-06 NOTE — Transfer of Care (Signed)
Immediate Anesthesia Transfer of Care Note  Patient: [name]  Procedure(s) Performed: Procedure(s): CYSTOSCOPY WITH LEFT RETROGRADE PYELOGRAM, LEFT STENT PLACEMENT, LEFT URETEROSCOPY, LASER LITHOTRIPSY (Left)  Patient Location: PACU  Anesthesia Type:General  Level of Consciousness: sedated  Airway & Oxygen Therapy: Patient Spontanous Breathing and Patient connected to face mask oxygen  Post-op Assessment: Report given to PACU RN and Post -op Vital signs reviewed and stable  Post vital signs: Reviewed and stable  Complications: No apparent anesthesia complications

## 2013-01-06 NOTE — Anesthesia Preprocedure Evaluation (Addendum)
Anesthesia Evaluation  Patient identified by MRN, date of birth, ID band Patient awake    Reviewed: Allergy & Precautions, H&P , NPO status , Patient's Chart, lab work & pertinent test results  Airway Mallampati: II TM Distance: >3 FB Neck ROM: full    Dental no notable dental hx. (+) Teeth Intact and Dental Advisory Given   Pulmonary neg pulmonary ROS,  breath sounds clear to auscultation  Pulmonary exam normal       Cardiovascular Exercise Tolerance: Good negative cardio ROS  Rhythm:regular Rate:Normal     Neuro/Psych negative neurological ROS  negative psych ROS   GI/Hepatic negative GI ROS, Neg liver ROS,   Endo/Other  negative endocrine ROS  Renal/GU ARFRenal diseaseBUN 60 CRT 3.0  negative genitourinary   Musculoskeletal   Abdominal   Peds  Hematology negative hematology ROS (+) plt 121K   Anesthesia Other Findings   Reproductive/Obstetrics negative OB ROS                           Anesthesia Physical Anesthesia Plan  ASA: II  Anesthesia Plan: General   Post-op Pain Management:    Induction: Intravenous  Airway Management Planned: LMA  Additional Equipment:   Intra-op Plan:   Post-operative Plan:   Informed Consent: I have reviewed the patients History and Physical, chart, labs and discussed the procedure including the risks, benefits and alternatives for the proposed anesthesia with the patient or authorized representative who has indicated his/her understanding and acceptance.   Dental Advisory Given  Plan Discussed with: CRNA and Surgeon  Anesthesia Plan Comments:         Anesthesia Quick Evaluation

## 2013-01-06 NOTE — Preoperative (Signed)
Beta Blockers   Reason not to administer Beta Blockers:Not Applicable 

## 2013-01-07 ENCOUNTER — Encounter (HOSPITAL_COMMUNITY): Payer: Self-pay | Admitting: Urology

## 2013-01-07 DIAGNOSIS — N201 Calculus of ureter: Principal | ICD-10-CM

## 2013-01-07 DIAGNOSIS — N390 Urinary tract infection, site not specified: Secondary | ICD-10-CM

## 2013-01-07 DIAGNOSIS — N179 Acute kidney failure, unspecified: Secondary | ICD-10-CM

## 2013-01-07 DIAGNOSIS — D72829 Elevated white blood cell count, unspecified: Secondary | ICD-10-CM

## 2013-01-07 LAB — LIPASE, BLOOD: Lipase: 8 U/L — ABNORMAL LOW (ref 11–59)

## 2013-01-07 LAB — CBC
HCT: 28.3 % — ABNORMAL LOW (ref 36.0–46.0)
Hemoglobin: 10.3 g/dL — ABNORMAL LOW (ref 12.0–15.0)
MCH: 32.4 pg (ref 26.0–34.0)
MCHC: 36.4 g/dL — ABNORMAL HIGH (ref 30.0–36.0)
MCV: 89 fL (ref 78.0–100.0)
Platelets: 90 10*3/uL — ABNORMAL LOW (ref 150–400)
RBC: 3.18 MIL/uL — ABNORMAL LOW (ref 3.87–5.11)
RDW: 13.5 % (ref 11.5–15.5)
WBC: 24.7 10*3/uL — ABNORMAL HIGH (ref 4.0–10.5)

## 2013-01-07 LAB — COMPREHENSIVE METABOLIC PANEL
ALT: 13 U/L (ref 0–35)
AST: 21 U/L (ref 0–37)
Albumin: 1.9 g/dL — ABNORMAL LOW (ref 3.5–5.2)
Alkaline Phosphatase: 204 U/L — ABNORMAL HIGH (ref 39–117)
BUN: 51 mg/dL — ABNORMAL HIGH (ref 6–23)
CO2: 18 mEq/L — ABNORMAL LOW (ref 19–32)
Calcium: 8.8 mg/dL (ref 8.4–10.5)
Chloride: 101 mEq/L (ref 96–112)
Creatinine, Ser: 2.01 mg/dL — ABNORMAL HIGH (ref 0.50–1.10)
GFR calc Af Amer: 35 mL/min — ABNORMAL LOW (ref >90)
GFR calc non Af Amer: 30 mL/min — ABNORMAL LOW (ref >90)
Glucose, Bld: 90 mg/dL (ref 70–99)
Potassium: 4 mEq/L (ref 3.5–5.1)
Sodium: 136 mEq/L (ref 135–145)
Total Bilirubin: 0.2 mg/dL — ABNORMAL LOW (ref 0.3–1.2)
Total Protein: 5.4 g/dL — ABNORMAL LOW (ref 6.0–8.3)

## 2013-01-07 NOTE — Progress Notes (Signed)
1 Day Post-Op Subjective: Patient reports doing much better this morning. Her nausea has resolved. She is not experiencing any flank pain or irritative bladder symptoms. She is tolerating her stent quite well.  Objective: Vital signs in last 24 hours: Temp:  [97.5 F (36.4 C)-98.5 F (36.9 C)] 98.3 F (36.8 C) (11/14 0440) Pulse Rate:  [79-129] 79 (11/14 0440) Resp:  [10-21] 14 (11/14 0440) BP: (100-124)/(58-83) 113/77 mmHg (11/14 0440) SpO2:  [96 %-100 %] 96 % (11/14 0440) Weight:  [52.617 kg (116 lb)] 52.617 kg (116 lb) (11/13 1825)  Intake/Output from previous day: 11/13 0701 - 11/14 0700 In: 2925 [I.V.:2925] Out: 701 [Urine:701] Intake/Output this shift:    Physical Exam:  General:alert, cooperative and no distress GI: not done and soft she has no CVAT.   Lab Results:  Recent Labs  01/06/13 0802 01/07/13 0030  HGB 12.1 10.3*  HCT 34.8* 28.3*   BMET  Recent Labs  01/06/13 0802 01/07/13 0030  NA 134* 136  K 4.5 4.0  CL 94* 101  CO2 16* 18*  GLUCOSE 54* 90  BUN 64* 51*  CREATININE 3.05* 2.01*  CALCIUM 9.0 8.8   No results found for this basename: LABPT, INR,  in the last 72 hours No results found for this basename: LABURIN,  in the last 72 hours No results found for this or any previous visit.  Studies/Results: Ct Abdomen Pelvis Wo Contrast  01/06/2013   CLINICAL DATA:  37 year old female 6 weeks postpartum status post C-section. Epigastric pain with vomiting and diarrhea. Pain greater on the left. Renal insufficiency. Initial encounter.  EXAM: CT ABDOMEN AND PELVIS WITHOUT CONTRAST  TECHNIQUE: Multidetector CT imaging of the abdomen and pelvis was performed following the standard protocol without intravenous contrast.  COMPARISON:  Pelvis ultrasound 08/31/2007.  FINDINGS: Mild lung base atelectasis.  No pericardial or pleural effusion.  Scoliosis. No acute osseous abnormality identified.  Small to moderate volume of simple appearing pelvic free fluid  (series 2, image 67). Negative rectum. Negative non contrast uterus and adnexal.  Fluid in the distal colon. Decompressed left colon. Oral contrast has reached the proximal transverse colon. Redundant hepatic flexure and cecum on a lax mesenteries such that the cecum is located in the right upper quadrant. Normal appendix (coronal image 23). No dilated small bowel. Negative stomach and duodenum.  Mild periportal edema, otherwise negative non contrast appearance of the liver. Suggestion of gallbladder sludge. Suggestion of mild pericholecystic fluid or mild gallbladder wall thickening. Still, no pericholecystic stranding.  Negative non contrast spleen, pancreas and adrenal glands.  Normal non contrast right kidney and ureter.  Unremarkable bladder.  Enlarged and indistinct left kidney with mild perinephric stranding. There is a 6 x 5 x 9 mm calculus lodged at the left ureteropelvic junction (coronal image 59). There is a smaller nonobstructing calculus in the left lower pole. Beyond the UPJ calculus, the left ureter is diminutive. The left gonadal vein does appear mildly prominent. No additional left ureteral calculus identified.  No abdominal free fluid.  No free air.  IMPRESSION: 1. Acute obstructive uropathy on the left with a 6 x 5 x 9 mm calculus (AP by transverse by CC) lodged at the left UPJ. Enlarged and indistinct appearing left kidney.  2. No other acute process identified in the abdomen. Small to moderate nonspecific simple appearing pelvic free fluid, favor reactive.  3. Suspect mild periportal edema and trace pericholecystic fluid, may be reactive in this setting. Subtle evidence of gallbladder sludge.  4. Incidentally,  the cecum is on a lax mesenteries and located in the right upper quadrant as is the appendix which otherwise appears normal.   Electronically Signed   By: Augusto Gamble M.D.   On: 01/06/2013 11:35    Assessment/Plan: She has improved significantly with a left stent having been placed  yesterday. She is tolerating the stent with no flank pain or bladder irritation from the stent. Her creatinine has improved with the stent and hydration. She did have a drop in her hemoglobin. She has a history of anemia and has been on iron. Her decreased hemoglobin is almost certainly due to dilution. She had no significant blood loss during her surgery. Her stone was impacted and we discussed the fact that I perform ureteroscopy and laser lithotripsy of the stone. She will return to my office for a KUB and determination of whether her stent can be removed or whether any further procedures will be necessary for treatment of retained fragments. Although her creatinine has improved it is not to baseline yet. There would appear to be no reason why it would not continue to improve with oral hydration and stent drainage of her kidney. She would appear to be ready for discharge from a urologic standpoint very soon.  She will followup with me as an outpatient next week for a KUB.  I put my office information including a phone number on the chart and also have included discharge instructions for her.   LOS: 1 day   Abella Shugart C 01/07/2013, 7:18 AM

## 2013-01-07 NOTE — Progress Notes (Signed)
[facility] PROGRESS NOTE  [name] JYN:[id number] DOB: 1975/07/28 DOA: 01/06/2013 PCP: Evette Georges, MD  Brief narrative: 37 -year-old female status post C-section about 6 weeks prior to this admission [facility], vomiting and diarrhea. Patient was found to have acute renal failure with acute elevation of creatinine at around 3.0. In addition, patient was found to have obstructive ureteral stones. Patient was seen by urologist and patient is now status post stent placement. She's also receiving treatment for urinary tract infection.  Assessment/Plan:  Principal problem:   Ureteral calculus, left - Obstructive ureteral stone that is post stenting, no subsequent complications - Appreciate urology following - Continue treatment for urinary tract infection  Active problems: Urinary tract infection - Continue Rocephin daily - Followup urine culture results Acute renal failure - Secondary to obstructive uropathy - Creatinine trended down - Continue IV fluids - Followup kidney function in the morning Leukocytosis - Likely related to urinary tract infection - White blood cell count slowly trending down Thrombocytopenia - We will use SCDs for DVT prophylaxis. Acute blood loss anemia - Likely postoperative - Continue to monitor CBC   Code Status: Full code Family Communication: No family at the bedside Disposition Plan: Home when stable  Manson Passey, MD  [facility] Pager [phone]  If 7PM-7AM, please contact night-coverage [email or web address] Password [email or web address] 01/07/2013, 12:56 PM   LOS: 1 day   Consultants:  Urology  Procedures:  Stent placement 01/06/2013  Antibiotics:  Rocephin 01/06/2013 -->  HPI/Subjective: Feels better this morning.  Objective: Filed Vitals:   01/06/13 1748 01/06/13 1825 01/06/13 2100 01/07/13 0440  BP: 110/70 110/70 120/75 113/77  Pulse: 120 117 125 79  Temp: 98.3 F (36.8  C) 98.3 F (36.8 C) 98.4 F (36.9 C) 98.3 F (36.8 C)  TempSrc:  Oral Oral Oral  Resp: 15  14 14   Height:  5' 1.5" (1.562 m)    Weight:  52.617 kg (116 lb)    SpO2: 100% 100% 98% 96%    Intake/Output Summary (Last 24 hours) at 01/07/13 1256 Last data filed at 01/07/13 0600  Gross per 24 hour  Intake   2925 ml  Output    701 ml  Net   2224 ml    Exam:   General:  Pt is alert, follows commands appropriately, not in acute distress  Cardiovascular: Regular rate and rhythm, S1/S2, no murmurs, no rubs, no gallops  Respiratory: Clear to auscultation bilaterally, no wheezing, no crackles, no rhonchi  Abdomen: Soft, non tender, non distended, bowel sounds present, no guarding  Extremities: No edema, pulses DP and PT palpable bilaterally  Neuro: Grossly nonfocal  Data Reviewed: Basic Metabolic Panel:  Recent Labs Lab 01/06/13 0802 01/07/13 0030  NA 134* 136  K 4.5 4.0  CL 94* 101  CO2 16* 18*  GLUCOSE 54* 90  BUN 64* 51*  CREATININE 3.05* 2.01*  CALCIUM 9.0 8.8   Liver Function Tests:  Recent Labs Lab 01/06/13 0802 01/07/13 0030  AST 25 21  ALT 16 13  ALKPHOS 232* 204*  BILITOT 0.3 0.2*  PROT 6.2 5.4*  ALBUMIN 2.5* 1.9*    Recent Labs Lab 01/07/13 0030  LIPASE 8*   No results found for this basename: AMMONIA,  in the last 168 hours CBC:  Recent Labs Lab 01/06/13 0802 01/07/13 0030  WBC 25.2* 24.7*  HGB 12.1 10.3*  HCT 34.8* 28.3*  MCV 89.5 89.0  PLT 121* 90*   Cardiac Enzymes: No results found  for this basename: CKTOTAL, CKMB, CKMBINDEX, TROPONINI,  in the last 168 hours BNP: No components found with this basename: POCBNP,  CBG: No results found for this basename: GLUCAP,  in the last 168 hours  Recent Results (from the past 240 hour(s))  URINE CULTURE     Status: None   Collection Time    01/06/13 10:30 AM      Result Value Range Status   Specimen Description URINE, CLEAN CATCH   Final   Special Requests NONE   Final   Culture   Setup Time     Final   Value: 01/06/2013 14:32     Performed at [facility]   Culture     Final   Value: >=100,000 COLONIES/mL GRAM NEGATIVE RODS     Performed at [facility]   Report Status PENDING   Incomplete     Studies: Ct Abdomen Pelvis Wo Contrast  01/06/2013   CLINICAL DATA:  37 year old female 6 weeks postpartum status post C-section. Epigastric pain with vomiting and diarrhea. Pain greater on the left. Renal insufficiency. Initial encounter.  EXAM: CT ABDOMEN AND PELVIS WITHOUT CONTRAST  TECHNIQUE: Multidetector CT imaging of the abdomen and pelvis was performed following the standard protocol without intravenous contrast.  COMPARISON:  Pelvis ultrasound 08/31/2007.  FINDINGS: Mild lung base atelectasis.  No pericardial or pleural effusion.  Scoliosis. No acute osseous abnormality identified.  Small to moderate volume of simple appearing pelvic free fluid (series 2, image 67). Negative rectum. Negative non contrast uterus and adnexal.  Fluid in the distal colon. Decompressed left colon. Oral contrast has reached the proximal transverse colon. Redundant hepatic flexure and cecum on a lax mesenteries such that the cecum is located in the right upper quadrant. Normal appendix (coronal image 23). No dilated small bowel. Negative stomach and duodenum.  Mild periportal edema, otherwise negative non contrast appearance of the liver. Suggestion of gallbladder sludge. Suggestion of mild pericholecystic fluid or mild gallbladder wall thickening. Still, no pericholecystic stranding.  Negative non contrast spleen, pancreas and adrenal glands.  Normal non contrast right kidney and ureter.  Unremarkable bladder.  Enlarged and indistinct left kidney with mild perinephric stranding. There is a 6 x 5 x 9 mm calculus lodged at the left ureteropelvic junction (coronal image 59). There is a smaller nonobstructing calculus in the left lower pole. Beyond the UPJ calculus, the left ureter is  diminutive. The left gonadal vein does appear mildly prominent. No additional left ureteral calculus identified.  No abdominal free fluid.  No free air.  IMPRESSION: 1. Acute obstructive uropathy on the left with a 6 x 5 x 9 mm calculus (AP by transverse by CC) lodged at the left UPJ. Enlarged and indistinct appearing left kidney.  2. No other acute process identified in the abdomen. Small to moderate nonspecific simple appearing pelvic free fluid, favor reactive.  3. Suspect mild periportal edema and trace pericholecystic fluid, may be reactive in this setting. Subtle evidence of gallbladder sludge.  4. Incidentally, the cecum is on a lax mesenteries and located in the right upper quadrant as is the appendix which otherwise appears normal.   Electronically Signed   By: Augusto Gamble M.D.   On: 01/06/2013 11:35    Scheduled Meds: . cefTRIAXone (ROCEPHIN)  IV  1 g Intravenous Q24H  . influenza vac split quadrivalent PF  0.5 mL Intramuscular Tomorrow-1000   Continuous Infusions: . sodium chloride 125 mL/hr at 01/07/13 0014  . lactated ringers Stopped (01/06/13 1510)  .  lactated ringers

## 2013-01-08 DIAGNOSIS — F410 Panic disorder [episodic paroxysmal anxiety] without agoraphobia: Secondary | ICD-10-CM

## 2013-01-08 LAB — BASIC METABOLIC PANEL
BUN: 53 mg/dL — ABNORMAL HIGH (ref 6–23)
Chloride: 108 mEq/L (ref 96–112)
Creatinine, Ser: 1.5 mg/dL — ABNORMAL HIGH (ref 0.50–1.10)
GFR calc Af Amer: 50 mL/min — ABNORMAL LOW (ref >90)
GFR calc non Af Amer: 43 mL/min — ABNORMAL LOW (ref >90)
Glucose, Bld: 135 mg/dL — ABNORMAL HIGH (ref 70–99)
Potassium: 4 mEq/L (ref 3.5–5.1)
Sodium: 138 mEq/L (ref 135–145)

## 2013-01-08 LAB — CBC
HCT: 35.6 % — ABNORMAL LOW (ref 36.0–46.0)
Hemoglobin: 12.6 g/dL (ref 12.0–15.0)
MCHC: 35.4 g/dL (ref 30.0–36.0)
MCV: 88.3 fL (ref 78.0–100.0)
RDW: 14 % (ref 11.5–15.5)
WBC: 23.5 10*3/uL — ABNORMAL HIGH (ref 4.0–10.5)

## 2013-01-08 LAB — URINE CULTURE: Culture: >=100000

## 2013-01-08 MED ORDER — CIPROFLOXACIN HCL 500 MG PO TABS: 500.0000 mg | ORAL_TABLET | Freq: Two times a day (BID) | ORAL | Status: DC

## 2013-01-08 NOTE — Discharge Summary (Signed)
Physician Discharge Summary  [name] ZOX:[id number] DOB: [birth year] DOA: 01/06/2013  PCP: Evette Georges, MD  Admit date: 01/06/2013 Discharge date: 01/08/2013  Recommendations for Outpatient Follow-up:  For Klebsiella urinary tract infection please take Cipro for 10 days. Follow up with urology next week per urology recomendations, call to make an appointment.  Discharge Diagnoses:  Active Problems:   Klebsiella UTI   Nephrolothiasis    Discharge Condition: medically stable for discharge home today  Diet recommendation: as tolerated   History of present illness:  37 year-old female status post C-section about 6 weeks prior to this admission [facility] vomiting and diarrhea. Patient was found to have acute renal failure with acute elevation of creatinine at around 3.0. In addition, patient was found to have obstructive ureteral stones. Patient was seen by urologist and patient is now status post stent placement. She's also receiving treatment for urinary tract infection.   Assessment/Plan:   Principal problem:  Ureteral calculus, left  - Obstructive ureteral stone that is post stenting, no subsequent complications  - Appreciate urology following  - Continue treatment for urinary tract infection with cipro on discharge Active problems:  Urinary tract infection  - Given Rocephin daily in hospital and cipro on discharge  - secondary to Klebsiella UTI Acute renal failure  - Secondary to obstructive uropathy  - Creatinine trended down  Leukocytosis  - Likely related to urinary tract infection  - White blood cell count slowly trending down  Thrombocytopenia  - We will use SCDs for DVT prophylaxis.  Acute blood loss anemia  - Likely postoperative  - hemoglobin 12.6 on discharge, stable  Code Status: Full code  Family Communication: No family at the bedside     Signed:  Manson Passey, MD  [facility] 01/08/2013, 12:24 PM  Pager #: [phone]    Discharge Exam: Filed Vitals:   01/08/13 0650  BP: 140/80  Pulse: 63  Temp: 98.1 F (36.7 C)  Resp: 18   Filed Vitals:   01/07/13 0440 01/07/13 1423 01/07/13 2151 01/08/13 0650  BP: 113/77 125/90 139/87 140/80  Pulse: 79 76 59 63  Temp: 98.3 F (36.8 C)  98.3 F (36.8 C) 98.1 F (36.7 C)  TempSrc: Oral  Oral Oral  Resp: 14 16 18 18   Height:      Weight:      SpO2: 96% 99% 99% 100%    General: Pt is alert, follows commands appropriately, not in acute distress Cardiovascular: Regular rate and rhythm, S1/S2 +, no murmurs, no rubs, no gallops Respiratory: Clear to auscultation bilaterally, no wheezing, no crackles, no rhonchi Abdominal: Soft, non tender, non distended, bowel sounds +, no guarding Extremities: no edema, no cyanosis, pulses palpable bilaterally DP and PT Neuro: Grossly nonfocal  Discharge Instructions  Discharge Orders   Future Appointments Provider Department Dept Phone   01/24/2013 12:00 PM [facility] [phone]   01/24/2013 12:15 PM Sharlotte [facility] [phone]   01/24/2013 12:45 PM Josph Macho, MD [place] [facility] [phone]   Future Orders Complete By Expires   Call MD for:  difficulty breathing, headache or visual disturbances  As directed    Call MD for:  persistant dizziness or light-headedness  As directed    Call MD for:  persistant nausea and vomiting  As directed    Call MD for:  severe uncontrolled pain  As directed    Diet - low sodium heart healthy  As directed    Discharge instructions  As directed    Comments:     For Klebsiella urinary tract infection please take Cipro for 10 days. Follow up with urology next week per urology recomendations, call to make an appointment.   Increase activity slowly  As directed        Medication List    STOP  taking these medications       fluconazole 150 MG tablet  Commonly known as:  DIFLUCAN      TAKE these medications       ciprofloxacin 500 MG tablet  Commonly known as:  CIPRO  Take 1 tablet (500 mg total) by mouth 2 (two) times daily.     citalopram 10 MG tablet  Commonly known as:  CELEXA  Take 10 mg by mouth daily.     ibuprofen 600 MG tablet  Commonly known as:  ADVIL,MOTRIN  Take 1 tablet (600 mg total) by mouth every 6 (six) hours.     TYLENOL PO  Take 1 tablet by mouth every 8 (eight) hours as needed (takes for pain).           Follow-up Information   Follow up with Garnett Farm, MD. Call on 01/08/2013. (Call for an appointment in about a week.)    Specialty:  Urology   Contact information:   [address] [place] [address] [phone]        The results of significant diagnostics from this hospitalization (including imaging, microbiology, ancillary and laboratory) are listed below for reference.    Significant Diagnostic Studies: Ct Abdomen Pelvis Wo Contrast  01/06/2013   CLINICAL DATA:  37 year old female 6 weeks postpartum status post C-section. Epigastric pain with vomiting and diarrhea. Pain greater on the left. Renal insufficiency. Initial encounter.  EXAM: CT ABDOMEN AND PELVIS WITHOUT CONTRAST  TECHNIQUE: Multidetector CT imaging of the abdomen and pelvis was performed following the standard protocol without intravenous contrast.  COMPARISON:  Pelvis ultrasound 08/31/2007.  FINDINGS: Mild lung base atelectasis.  No pericardial or pleural effusion.  Scoliosis. No acute osseous abnormality identified.  Small to moderate volume of simple appearing pelvic free fluid (series 2, image 67). Negative rectum. Negative non contrast uterus and adnexal.  Fluid in the distal colon. Decompressed left colon. Oral contrast has reached the proximal transverse colon. Redundant hepatic flexure and cecum on a lax mesenteries such that the cecum is located in the  right upper quadrant. Normal appendix (coronal image 23). No dilated small bowel. Negative stomach and duodenum.  Mild periportal edema, otherwise negative non contrast appearance of the liver. Suggestion of gallbladder sludge. Suggestion of mild pericholecystic fluid or mild gallbladder wall thickening. Still, no pericholecystic stranding.  Negative non contrast spleen, pancreas and adrenal glands.  Normal non contrast right kidney and ureter.  Unremarkable bladder.  Enlarged and indistinct left kidney with mild perinephric stranding. There is a 6 x 5 x 9 mm calculus lodged at the left ureteropelvic junction (coronal image 59). There is a smaller nonobstructing calculus in the left lower pole. Beyond the UPJ calculus, the left ureter is diminutive. The left gonadal vein does appear mildly prominent. No additional left ureteral calculus identified.  No abdominal free fluid.  No free air.  IMPRESSION: 1. Acute obstructive uropathy on the left with a 6 x 5 x 9 mm calculus (AP by transverse by CC) lodged at the left UPJ. Enlarged and indistinct appearing  left kidney.  2. No other acute process identified in the abdomen. Small to moderate nonspecific simple appearing pelvic free fluid, favor reactive.  3. Suspect mild periportal edema and trace pericholecystic fluid, may be reactive in this setting. Subtle evidence of gallbladder sludge.  4. Incidentally, the cecum is on a lax mesenteries and located in the right upper quadrant as is the appendix which otherwise appears normal.   Electronically Signed   By: Augusto Gamble M.D.   On: 01/06/2013 11:35    Microbiology: Recent Results (from the past 240 hour(s))  URINE CULTURE     Status: None   Collection Time    01/06/13 10:30 AM      Result Value Range Status   Specimen Description URINE, CLEAN CATCH   Final   Special Requests NONE   Final   Culture  Setup Time     Final   Value: 01/06/2013 14:32     Performed at [facility]   Culture     Final    Value: >=100,000 COLONIES/mL KLEBSIELLA PNEUMONIAE     Performed at [facility]   Report Status 01/08/2013 FINAL   Final   Organism ID, Bacteria KLEBSIELLA PNEUMONIAE   Final     Labs: Basic Metabolic Panel:  Recent Labs Lab 01/06/13 0802 01/07/13 0030 01/08/13 0515  NA 134* 136 138  K 4.5 4.0 4.0  CL 94* 101 108  CO2 16* 18* 20  GLUCOSE 54* 90 135*  BUN 64* 51* 53*  CREATININE 3.05* 2.01* 1.50*  CALCIUM 9.0 8.8 9.0   Liver Function Tests:  Recent Labs Lab 01/06/13 0802 01/07/13 0030  AST 25 21  ALT 16 13  ALKPHOS 232* 204*  BILITOT 0.3 0.2*  PROT 6.2 5.4*  ALBUMIN 2.5* 1.9*    Recent Labs Lab 01/07/13 0030  LIPASE 8*   No results found for this basename: AMMONIA,  in the last 168 hours CBC:  Recent Labs Lab 01/06/13 0802 01/07/13 0030 01/08/13 0515  WBC 25.2* 24.7* 23.5*  HGB 12.1 10.3* 12.6  HCT 34.8* 28.3* 35.6*  MCV 89.5 89.0 88.3  PLT 121* 90* 121*   Cardiac Enzymes: No results found for this basename: CKTOTAL, CKMB, CKMBINDEX, TROPONINI,  in the last 168 hours BNP: BNP (last 3 results) No results found for this basename: PROBNP,  in the last 8760 hours CBG: No results found for this basename: GLUCAP,  in the last 168 hours  Time coordinating discharge: Over 30 minutes

## 2013-01-13 ENCOUNTER — Telehealth: Payer: Self-pay | Admitting: Family Medicine

## 2013-01-13 MED ORDER — CITALOPRAM HYDROBROMIDE 10 MG PO TABS: 10.0000 mg | ORAL_TABLET | Freq: Every day | ORAL | Status: DC

## 2013-01-13 NOTE — Telephone Encounter (Signed)
Pt request refill of citalopram (CELEXA) 10 MG tablet Pt last seen 01/2011. Pt states she has been sick. Had a kidney stone  And has an 7 old baby. (5th child) Pharm: [facility] / [address]

## 2013-01-13 NOTE — Telephone Encounter (Signed)
Okay to refill the Celexa 10 mg,,,,,,, dispense 100 one tab daily refills x2 set up a office visit for January

## 2013-01-13 NOTE — Telephone Encounter (Signed)
Pt has been sch

## 2013-01-13 NOTE — Telephone Encounter (Signed)
Rx sent to pharmacy.  Please call patient and schedule an appointment.

## 2013-01-21 ENCOUNTER — Telehealth: Payer: Self-pay | Admitting: Hematology & Oncology

## 2013-01-21 NOTE — Telephone Encounter (Signed)
I spoke w NEW PATIENT today to remind them of their appointment with Dr. Ennever. Also, advised them to bring all meds and insurance information. ° °

## 2013-01-24 ENCOUNTER — Other Ambulatory Visit: Payer: Managed Care, Other (non HMO) | Admitting: Lab

## 2013-01-24 ENCOUNTER — Ambulatory Visit (HOSPITAL_BASED_OUTPATIENT_CLINIC_OR_DEPARTMENT_OTHER): Payer: Managed Care, Other (non HMO) | Admitting: Hematology & Oncology

## 2013-01-24 ENCOUNTER — Ambulatory Visit: Payer: Managed Care, Other (non HMO)

## 2013-01-24 ENCOUNTER — Other Ambulatory Visit (HOSPITAL_BASED_OUTPATIENT_CLINIC_OR_DEPARTMENT_OTHER): Payer: Managed Care, Other (non HMO) | Admitting: Lab

## 2013-01-24 VITALS — BP 123/65 | HR 69 | Temp 97.9°F | Resp 14 | Ht 62.0 in | Wt 112.0 lb

## 2013-01-24 DIAGNOSIS — D509 Iron deficiency anemia, unspecified: Secondary | ICD-10-CM

## 2013-01-24 DIAGNOSIS — D72829 Elevated white blood cell count, unspecified: Secondary | ICD-10-CM

## 2013-01-24 DIAGNOSIS — D699 Hemorrhagic condition, unspecified: Secondary | ICD-10-CM

## 2013-01-24 DIAGNOSIS — D649 Anemia, unspecified: Secondary | ICD-10-CM

## 2013-01-24 LAB — CBC WITH DIFFERENTIAL (CANCER CENTER ONLY)
BASO#: 0 10*3/uL (ref 0.0–0.2)
EOS%: 3.8 % (ref 0.0–7.0)
HCT: 31.9 % — ABNORMAL LOW (ref 34.8–46.6)
HGB: 10.6 g/dL — ABNORMAL LOW (ref 11.6–15.9)
LYMPH#: 1.7 10*3/uL (ref 0.9–3.3)
MCH: 30.1 pg (ref 26.0–34.0)
MCHC: 33.2 g/dL (ref 32.0–36.0)
MONO%: 8 % (ref 0.0–13.0)
NEUT%: 54.4 % (ref 39.6–80.0)
Platelets: 329 10*3/uL (ref 145–400)
RBC: 3.52 10*6/uL — ABNORMAL LOW (ref 3.70–5.32)

## 2013-01-24 LAB — IRON AND TIBC CHCC
%SAT: 25 % (ref 21–57)
Iron: 84 ug/dL (ref 41–142)
UIBC: 257 ug/dL (ref 120–384)

## 2013-01-24 LAB — RETICULOCYTES (CHCC)
ABS Retic: 14 10*3/uL — ABNORMAL LOW (ref 19.0–186.0)
Retic Ct Pct: 0.4 % (ref 0.4–2.3)

## 2013-01-24 LAB — LACTATE DEHYDROGENASE: LDH: 106 U/L (ref 94–250)

## 2013-01-24 NOTE — Progress Notes (Signed)
This office note has been dictated.

## 2013-01-26 ENCOUNTER — Encounter (HOSPITAL_COMMUNITY): Payer: Self-pay | Admitting: Urology

## 2013-01-26 NOTE — Addendum Note (Signed)
Addendum created 01/26/13 1309 by Gaetano Hawthorne, MD   Modules edited: Anesthesia Events, Anesthesia Responsible Staff

## 2013-01-28 ENCOUNTER — Other Ambulatory Visit: Payer: Self-pay | Admitting: Urology

## 2013-01-28 LAB — VON WILLEBRAND PANEL
Coagulation Factor VIII: 87 % (ref 73–140)
Ristocetin Co-factor, Plasma: 128 % (ref 42–200)

## 2013-01-29 NOTE — Progress Notes (Signed)
CC:   [name], M.D.  DIAGNOSES: 1. Anemia - postpartum. 2. Excessive bleeding during delivery of her fifth child.  HISTORY OF PRESENT ILLNESS:  [name] is a very charming 37 year old Caucasian female.  She is originally from [place].  She and her husband moved here 7 years ago.  He works for [employer].  She has 5 kids. She just gave birth to her youngest one a couple months ago.  She had a C-section.  Apparently, there was some excessive bleeding with this.  She has had no problems with bleeding before.  She has had 4 other children.  She had no problems with delivery.  She has had wisdom teeth taken out.  She has had no problems with bleeding from this.  She apparently was anemic after that delivery.  Per the records sent over, it looks like her hemoglobin was, I think,  down to 7.6.  She is on oral iron.  She had the C-section because of placenta previa.  Her baby is doing quite well right now.  Her other 4 kids are doing very well.  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  Again, she has had no problems with bleeding in the past.  She has had no weight loss or weight gain.  She is not, I think, a strict vegetarian.  She has had no rashes.  She has had no change in bowel or bladder habits.  Again she is currently referred to [facility] by Dr. Marcelle Overlie for the anemia and also to see if there is any bleeding tendency.  PAST MEDICAL HISTORY:  Remarkable for: 1. Rhinitis. 2. History of panic attacks. 3. Headaches.  ALLERGIES:  None.  MEDICATIONS:  Celexa 10 mg p.o. daily, Zofran 8 mg as needed, iron 30 cc daily, Motrin 600 mg q.6 hours p.r.n.  SOCIAL HISTORY:  Negative for tobacco use.  There is no alcohol use. There are no obvious occupational exposures.  FAMILY HISTORY:  Relatively noncontributory.  There is no history of bleeding in the family as far she knows.  There apparently is a thalassemia carrier with her mother-in-law.  Her father  did have melanoma.  He is okay.  REVIEW OF SYSTEMS:  As stated in history of present illness.  No additional findings noted on a 12-system review.  As mentioned, she did have a pilonidal cyst removed in the past.  PHYSICAL EXAMINATION:  General:  This is a well-developed, well- nourished white female in no obvious distress.  She is alert and oriented x3.  Vital Signs:  Temperature of 97.9, pulse 69, respiratory rate of 14, blood pressure 123/65.  Weight is 212 pounds.  Head and Neck:  Normocephalic, atraumatic skull.  There are no ocular or oral lesions.  There are no palpable cervical or supraclavicular lymph nodes. Lungs:  Clear bilaterally.  Cardiac:  Regular rate and rhythm with a normal S1 and S2.  There are no murmurs, rubs or bruits.  Abdomen: Soft.  She has good bowel sounds.  There is no fluid wave.  There is no palpable abdominal mass.  There is no palpable hepatosplenomegaly. Laparotomy scar is well healed.  There is no palpable hepatosplenomegaly.  Back:  No tenderness over the spine, ribs, or hips. Extremities:  Show no clubbing, cyanosis or edema.  Neurological:  Shows no focal neurological deficits.  Skin:  She has very fair skin.  I do not see any suspicious hyperpigmented lesions.  LABORATORIES STUDIES:  White cell count is 5.2, hemoglobin 10.2,  hematocrit 31.9, platelet count 329.  Ferritin is 170 with iron saturation of 25%.  Peripheral smear shows a normochromic, normocytic population of red blood cells.  There may be some slight anisocytosis.  I do not see any nucleated red blood cells.  There are no target cells.  I see no immature myeloid or lymphoid forms.  There are no hypersegmented polys. I see no rouleaux formation.  Platelets are adequate in number and size.  IMPRESSION:  Mr. Shorkey is a very charming 38 year old white female.  She has mild anemia.  Her blood smear looked pretty much unremarkable.  She was anemic after delivery.  One would have to  think that this will improve.  Again, she is on oral iron.  I do not see any hematologic malignancy that would be a problem.  I do not see need for a bone marrow biopsy on her.  I would be surprised if she has any bleeding tendency.  We did check her for von Willebrand disease.  It is possible that she may develop a spontaneous mutation.  However, I think this would be highly unlikely. However, she has all these kids.  I think it would be nice to check her. If, for some reason, she does test positive, then this would have implications for her children.  [name] kids are incredibly cute.  They are incredibly well behaved. They are also very talented.  They all sang a song while in the office.  I really enjoyed being with [name].  I explained to her that I did not feel that she had a hematologic issue.  Again, we will call her with the results of the lab work that we did on her today.  I do not think that we have to get her back.  She is very busy.  She has 5 kids, so it can be difficult for her to get to the office.  I spent a good hour with [name].  Again, we had a good time with her. I explained to her my recommendations.  She understands these and agrees.   ______________________________ Josph Macho, M.D. PRE/MEDQ  D:  01/24/2013  T:  01/28/2013  Job:  [id number]

## 2013-02-01 ENCOUNTER — Telehealth: Payer: Self-pay | Admitting: Nurse Practitioner

## 2013-02-01 NOTE — Telephone Encounter (Addendum)
Message copied by Glee Arvin on Tue Feb 01, 2013  3:42 PM ------      Message from: Arlan Organ R      Created: Mon Jan 31, 2013 10:02 PM       Call - No bleeding tendency that we can find!! Cindee Lame ------Pt verbalized understanding and appreciation.

## 2013-02-02 ENCOUNTER — Encounter (HOSPITAL_COMMUNITY): Payer: Self-pay | Admitting: *Deleted

## 2013-02-02 ENCOUNTER — Encounter (HOSPITAL_COMMUNITY): Payer: Self-pay | Admitting: Pharmacy Technician

## 2013-02-02 NOTE — Progress Notes (Signed)
Spoke to patient via phone,history obtained,updated.  Bring blue folder,insurance cards,picture ID,designated driver and living will,POA, if desires (to be placed on chart). Reinforced no aspirin(instructions to hold aspirin per your doctor), ibuprofen products 72 hours prior to procedure. No vitamins or herbal medicines 7 days prior to procedure.   Follow laxative instructions provided by urologist (office) and in blue folder. Wear easy on/off clothing and no jewelry except wedding rings and ear rings. Leave all other valuables at home. Verbalizes understanding of instructions  Arrive 12 15 2014 0645 [place] short stay  Verbalizes understanding of all instructions  Phone number to short stay given to pt. She will phone dr Vernie Ammons to ask about whether to stop "milk-producing" med, she recently had baby and is breastfeeding

## 2013-02-06 NOTE — H&P (Signed)
[name] is a 37 year old female who underwent left double-J stent placement for an obstructing left UPJ stone.   History of Present Illness Ureteral calculus: She was admitted to the hospital with abdominal pain and found to have an obstructing left UPJ stone and infected appearing urine. She also had an elevated creatinine. She underwent urgent stent placement and I could not get a guidewire past the stone and therefore had to fragment the stone ureteroscopically in order to get the stent in. She returns today for follow-up.   Past Medical History Problems  1. History of Anxiety (300.00)  Surgical History Problems  1. History of No Surgical Problems  Current Meds 1. Citalopram Hydrobromide 10 MG Oral Tablet;  Therapy: 20Nov2014 to Recorded 2. Ibuprofen 600 MG Oral Tablet;  Therapy: 29Sep2014 to Recorded  Allergies Medication  1. No Known Drug Allergies  Social History Problems  1. Alcohol use 2. Denied: History of Caffeine use 3. Married 4. Never smoker (V49.89)  Review of Systems Genitourinary, constitutional, skin, eye, otolaryngeal, hematologic/lymphatic, cardiovascular, pulmonary, endocrine, musculoskeletal, gastrointestinal, neurological and psychiatric system(s) were reviewed and pertinent findings if present are noted.  Gastrointestinal: nausea, vomiting, heartburn and diarrhea.  Constitutional: fever, night sweats, feeling tired (fatigue) and recent weight loss.  Psychiatric: anxiety.    Vitals Vital Signs Height: 5 ft 1.5 in Weight: 114 lb  BMI Calculated: 21.19 BSA Calculated: 1.5 Blood Pressure: 136 / 83 Heart Rate: 64  General appearance: alert and appears stated age Head: Normocephalic, without obvious abnormality, atraumatic Eyes: conjunctivae/corneas clear. EOM's intact.   Oropharynx: moist mucous membranes Neck: supple, symmetrical, trachea midline Resp: normal respiratory effort Cardio: regular rate and rhythm Back: symmetric, no curvature. ROM  normal.No CVA tenderness. GI: soft, non-tender; bowel sounds normal; no masses,  no organomegaly Extremities: extremities normal, atraumatic, no cyanosis or edema Skin: Skin color normal. No visible rashes or lesions Neurologic: Grossly normal  Assessment We discussed today the fact that on her KUB the stone appears to be located now in the lower pole of her kidney Preoperatively it measured about 7 mm in width and appears to have undergone minimal fragmentation from the ureteroscopic procedure for her stent placement.  We discussed treatment options for her stone and the fact that I would recommend leaving her stent in at this time. She is bothered by it slightly but I think it would be best to leave it in place for now. We discussed lithotripsy and ureteroscopy today in detail. I went over the pluses and minuses of each of these. We discussed the procedures in detail including the risks and complications, outpatient nature of the procedures, probability of success and anticipated postoperative course. She has elected to proceed with lithotripsy at this time.   Plan  1. Her urine was cultured.  The culture was found to be negative.  2. She is scheduled for lithotripsy of her left renal calculus.

## 2013-02-07 ENCOUNTER — Ambulatory Visit (HOSPITAL_COMMUNITY): Payer: Managed Care, Other (non HMO)

## 2013-02-07 ENCOUNTER — Encounter (HOSPITAL_COMMUNITY)
Admission: RE | Disposition: A | Discharge status: Home / Self Care | Payer: Self-pay | Source: Ambulatory Visit | Attending: Urology

## 2013-02-07 ENCOUNTER — Ambulatory Visit (HOSPITAL_COMMUNITY)
Admission: RE | Admit: 2013-02-07 | Discharge: 2013-02-07 | Disposition: A | Discharge status: Home / Self Care | Payer: Managed Care, Other (non HMO) | Source: Ambulatory Visit | Attending: Urology | Admitting: Urology

## 2013-02-07 ENCOUNTER — Encounter (HOSPITAL_COMMUNITY): Payer: Self-pay | Admitting: *Deleted

## 2013-02-07 DIAGNOSIS — N200 Calculus of kidney: Secondary | ICD-10-CM | POA: Insufficient documentation

## 2013-02-07 HISTORY — DX: Calculus of kidney: N20.0

## 2013-02-07 LAB — PREGNANCY, URINE: Preg Test, Ur: NEGATIVE

## 2013-02-07 SURGERY — LITHOTRIPSY, ESWL
Anesthesia: LOCAL | Laterality: Left

## 2013-02-07 MED ORDER — SODIUM CHLORIDE 0.9 % IV SOLN
INTRAVENOUS | Status: DC
  Administered 2013-02-07: 1000 mL via INTRAVENOUS

## 2013-02-07 MED ORDER — CIPROFLOXACIN HCL 500 MG PO TABS
500.0000 mg | ORAL_TABLET | ORAL | Status: AC
  Administered 2013-02-07: 500 mg via ORAL
  Filled 2013-02-07: qty 1

## 2013-02-07 MED ORDER — DIAZEPAM 5 MG PO TABS
10.0000 mg | ORAL_TABLET | ORAL | Status: AC
  Administered 2013-02-07: 10 mg via ORAL
  Filled 2013-02-07: qty 2

## 2013-02-07 MED ORDER — DIPHENHYDRAMINE HCL 25 MG PO CAPS
25.0000 mg | ORAL_CAPSULE | ORAL | Status: AC
  Administered 2013-02-07: 25 mg via ORAL
  Filled 2013-02-07: qty 1

## 2013-02-07 NOTE — Progress Notes (Signed)
Pt is in short stay for lithotripsy procedure  She is breast feeding, baby is 35 1/2 months of age. Note placed on chart for md and staff on lithotripsy truck

## 2013-02-07 NOTE — Op Note (Signed)
See [facility] OP note scanned into chart. 

## 2013-03-03 ENCOUNTER — Encounter: Payer: Self-pay | Admitting: Family Medicine

## 2013-03-03 ENCOUNTER — Ambulatory Visit (INDEPENDENT_AMBULATORY_CARE_PROVIDER_SITE_OTHER): Payer: Managed Care, Other (non HMO) | Admitting: Family Medicine

## 2013-03-03 VITALS — BP 110/70 | Temp 98.0°F | Wt 114.0 lb

## 2013-03-03 DIAGNOSIS — F410 Panic disorder [episodic paroxysmal anxiety] without agoraphobia: Secondary | ICD-10-CM

## 2013-03-03 MED ORDER — CITALOPRAM HYDROBROMIDE 10 MG PO TABS: 10.0000 mg | ORAL_TABLET | Freq: Every evening | ORAL | Status: DC

## 2013-03-03 NOTE — Patient Instructions (Signed)
Continue the Celexa 10 mg daily  Followup in one year or sooner if any problems

## 2013-03-03 NOTE — Progress Notes (Signed)
Pre visit review using our clinic review tool, if applicable. No additional management support is needed unless otherwise documented below in the visit note. 

## 2013-03-03 NOTE — Progress Notes (Signed)
   Subjective:    Patient ID: Karilyn Cotaonya Spindler, female    DOB: 04-30-1975, 38 y.o.   MRN: [id number]  HPI Archie Pattentonya is a 38 year old married female nonsmoker who comes in today for followup of anxiety  We started on Celexa 10 mg daily she feels much better. She would like to continue the current dose. No side effects to medication   Review of Systems    review of systems negative Objective:   Physical Exam  Well-developed well-nourished female no acute distress vital signs stable she is afebrile oriented x3 appropriate good mood good affect      Assessment & Plan:  History of anxiety attacks continue Celexa 10 mg daily

## 2013-12-10 IMAGING — CT CT ABD-PELV W/O CM
1 of 2 series · 14 of 32 positions shown, 18 images · non-contrast
Comparison: Pelvis ultrasound 08/31/2007.

CLINICAL DATA: 36-year-old female 6 weeks postpartum status post
C-section. Epigastric pain with vomiting and diarrhea. Pain greater
on the left. Renal insufficiency. Initial encounter.

EXAM:
CT ABDOMEN AND PELVIS WITHOUT CONTRAST
TECHNIQUE: Multidetector CT imaging of the abdomen and pelvis was performed
following the standard protocol without intravenous contrast.

[Series 2: routine abdomen/pelvis with · axial · 0.70mm/px · z∈[-649,-269]mm · 14 of 87 slices shown, 18 images]
[im 7/87  soft-tissue]
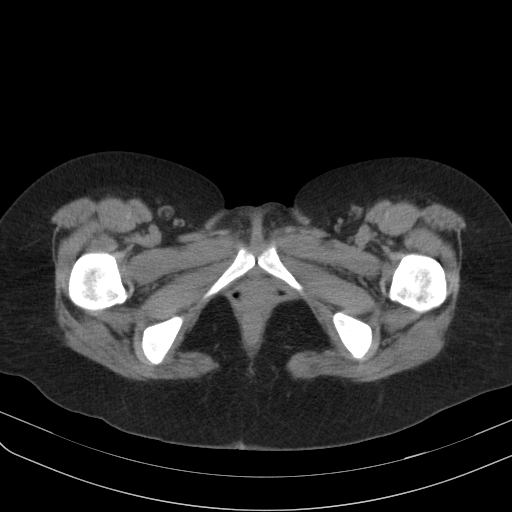
[im 7/87  bone]
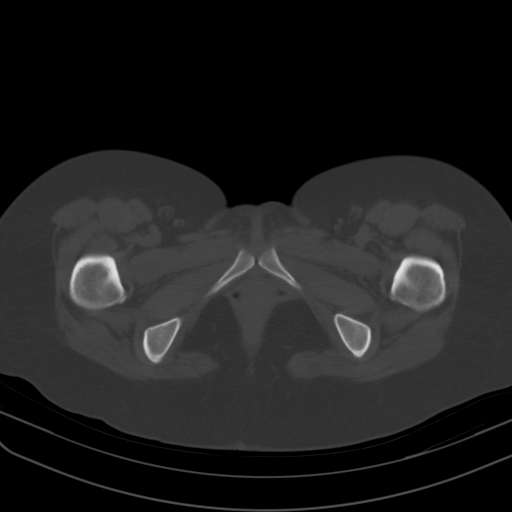
[im 14/87  soft-tissue]
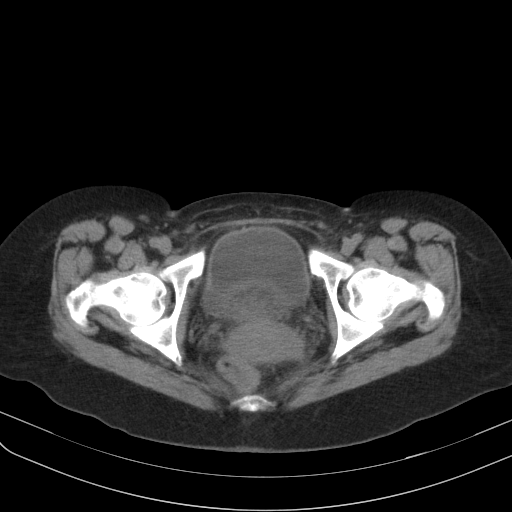
[im 20/87  soft-tissue]
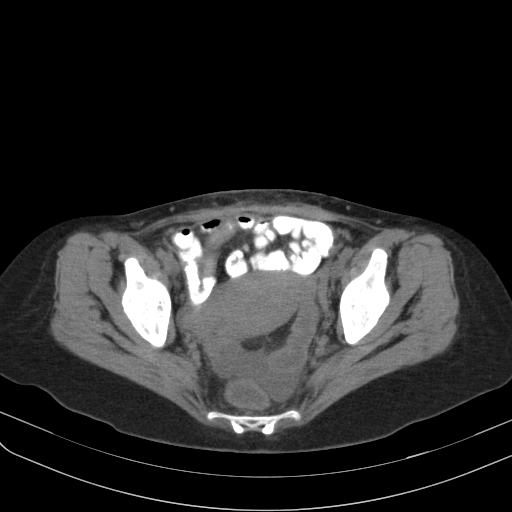
[im 27/87  soft-tissue]
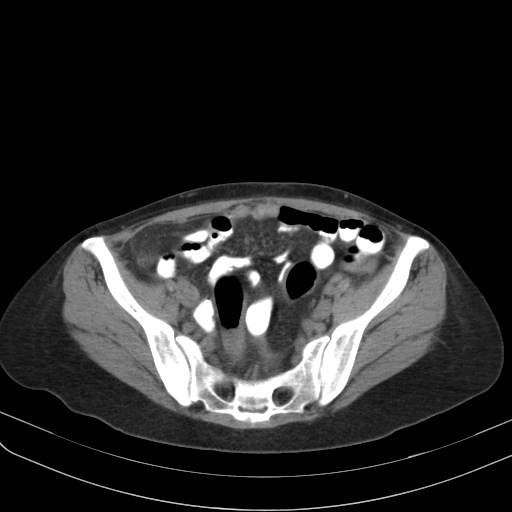
[im 34/87  soft-tissue]
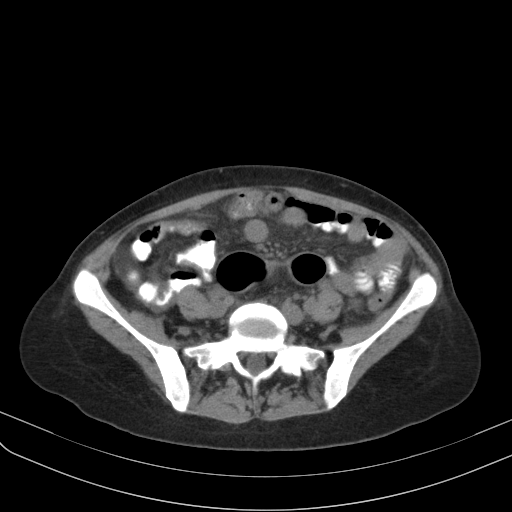
[im 40/87  soft-tissue]
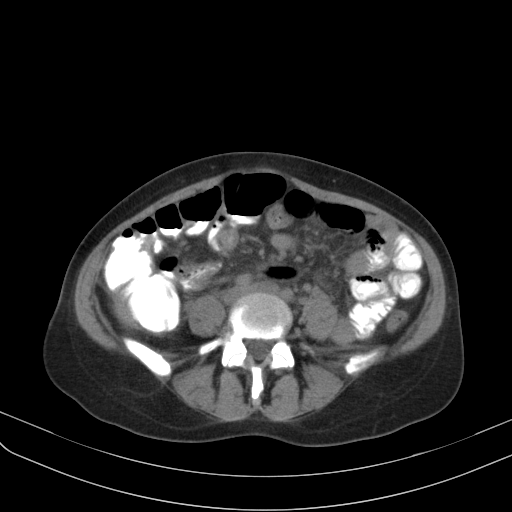
[im 47/87  soft-tissue]
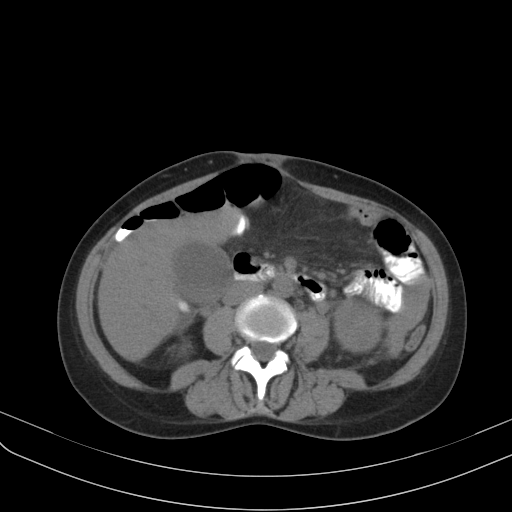
[im 53/87  soft-tissue]
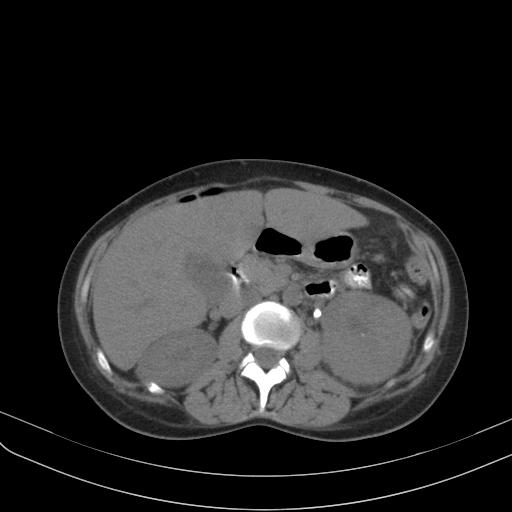
[im 60/87  soft-tissue]
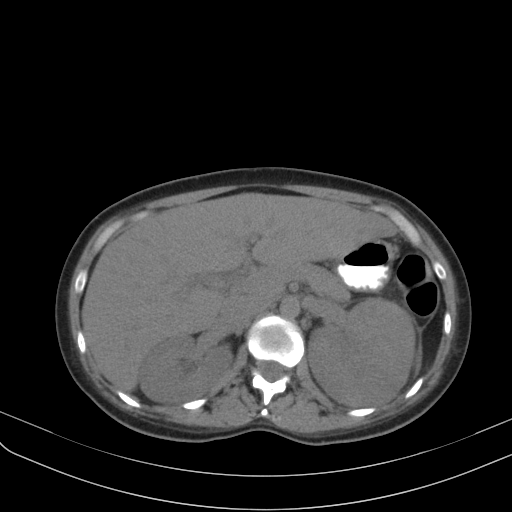
[im 60/87  bone]
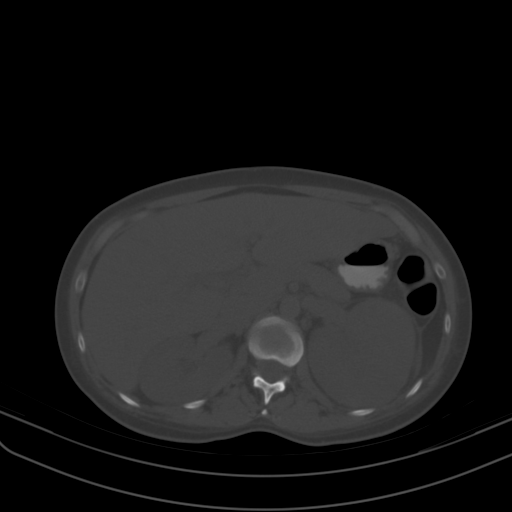
[im 67/87  soft-tissue]
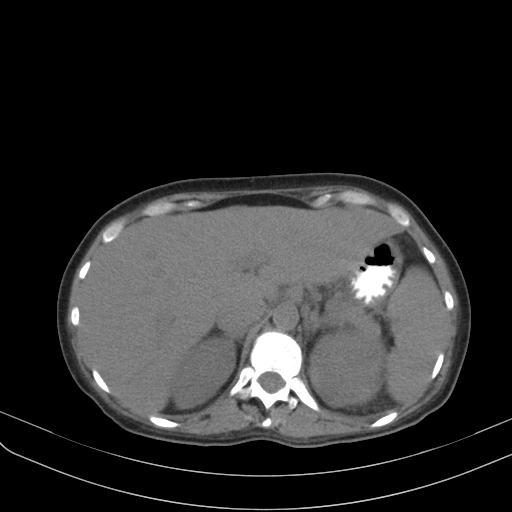
[im 73/87  soft-tissue]
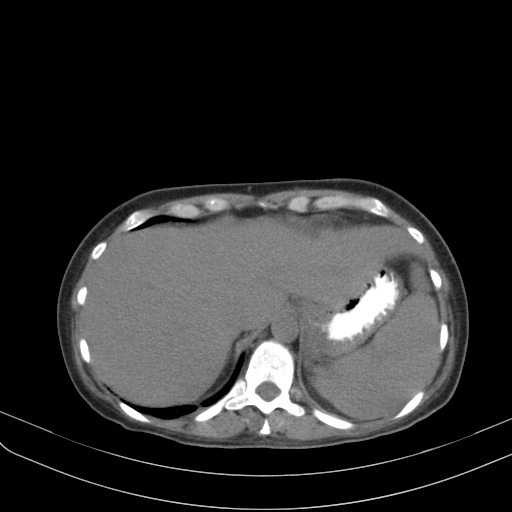
[im 73/87  lung]
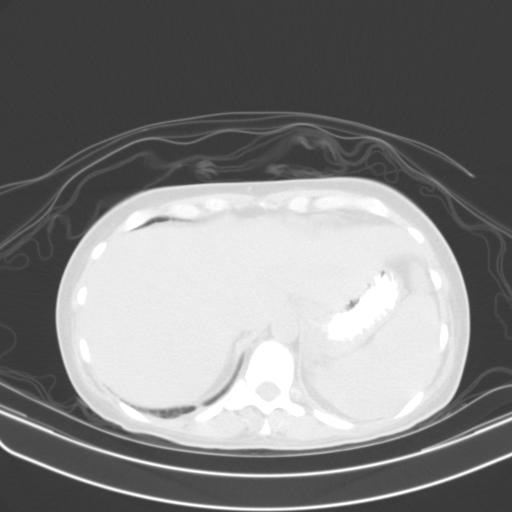
[im 77/87  lung]
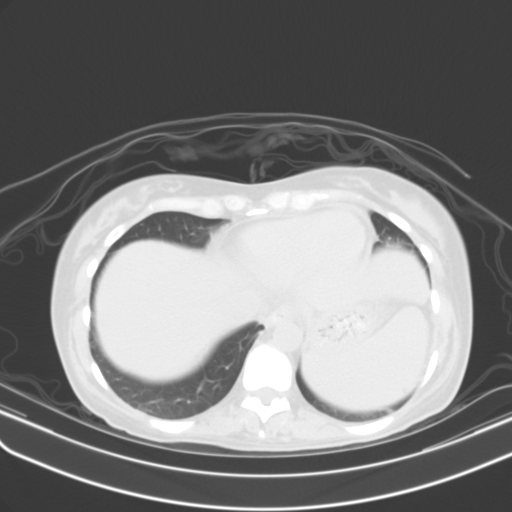
[im 80/87  soft-tissue]
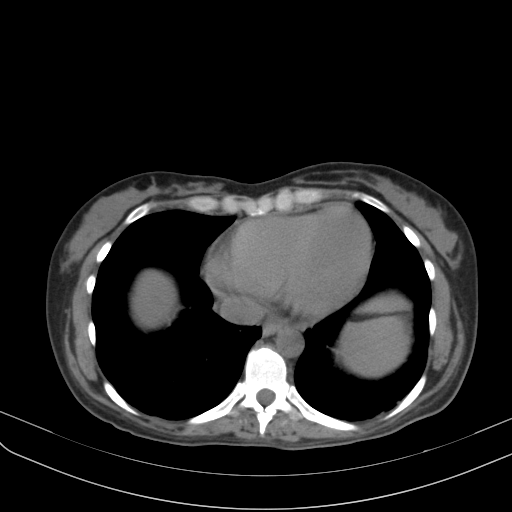
[im 80/87  lung]
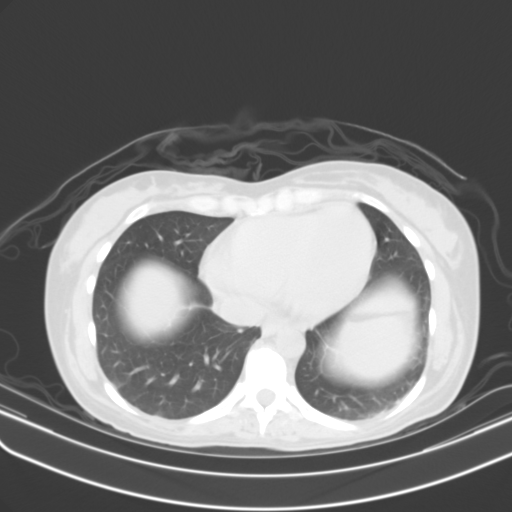
[im 83/87  lung]
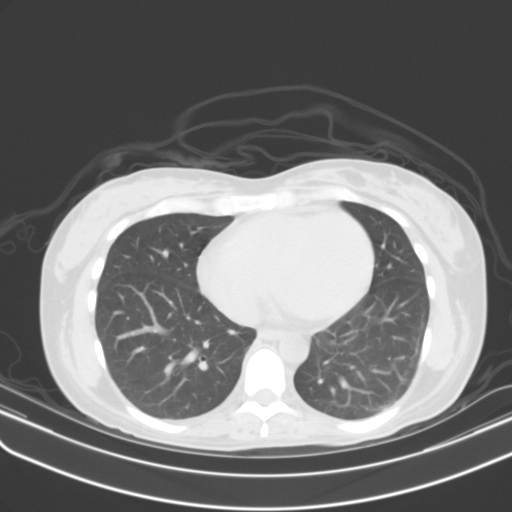

[14 of 32 positions shown; findings below may reference images not displayed]

FINDINGS: Mild lung base atelectasis.  No pericardial or pleural effusion.

Scoliosis. No acute osseous abnormality identified.

Small to moderate volume of simple appearing pelvic free fluid
(series 2, image 67). Negative rectum. Negative non contrast uterus
and adnexal.

Fluid in the distal colon. Decompressed left colon. Oral contrast
has reached the proximal transverse colon. Redundant hepatic flexure
and cecum on a lax mesenteries such that the cecum is located in the
right upper quadrant. Normal appendix (coronal image 23). No dilated
small bowel. Negative stomach and duodenum.

Mild periportal edema, otherwise negative non contrast appearance of
the liver. Suggestion of gallbladder sludge. Suggestion of mild
pericholecystic fluid or mild gallbladder wall thickening. Still, no
pericholecystic stranding.

Negative non contrast spleen, pancreas and adrenal glands.

Normal non contrast right kidney and ureter.  Unremarkable bladder.

Enlarged and indistinct left kidney with mild perinephric stranding.
There is a 6 x 5 x 9 mm calculus lodged at the left ureteropelvic
junction (coronal image 59). There is a smaller nonobstructing
calculus in the left lower pole. Beyond the UPJ calculus, the left
ureter is diminutive. The left gonadal vein does appear mildly
prominent. No additional left ureteral calculus identified.

No abdominal free fluid.  No free air.
IMPRESSION: 1. Acute obstructive uropathy on the left with a 6 x 5 x 9 mm
calculus (AP by transverse by CC) lodged at the left UPJ. Enlarged
and indistinct appearing left kidney.

2. No other acute process identified in the abdomen. Small to
moderate nonspecific simple appearing pelvic free fluid, favor
reactive.

3. Suspect mild periportal edema and trace pericholecystic fluid,
may be reactive in this setting. Subtle evidence of gallbladder
sludge.

4. Incidentally, the cecum is on a lax mesenteries and located in
the right upper quadrant as is the appendix which otherwise appears
normal.

## 2013-12-22 ENCOUNTER — Telehealth: Payer: Self-pay | Admitting: Family Medicine

## 2013-12-22 MED ORDER — PREDNISONE 20 MG PO TABS: 20.0000 mg | ORAL_TABLET | Freq: Every day | ORAL | Status: DC

## 2013-12-22 NOTE — Telephone Encounter (Signed)
Rx sent to pharmacy   

## 2013-12-22 NOTE — Telephone Encounter (Signed)
Pt has poison iyv and would like something call into [facility] [address]

## 2013-12-26 ENCOUNTER — Encounter: Payer: Self-pay | Admitting: Family Medicine

## 2014-03-30 ENCOUNTER — Other Ambulatory Visit: Payer: Self-pay | Admitting: Obstetrics and Gynecology

## 2014-04-03 LAB — CYTOLOGY - PAP

## 2014-04-08 ENCOUNTER — Other Ambulatory Visit: Payer: Self-pay | Admitting: Family Medicine

## 2014-04-08 DIAGNOSIS — F410 Panic disorder [episodic paroxysmal anxiety] without agoraphobia: Secondary | ICD-10-CM

## 2014-04-11 MED ORDER — CITALOPRAM HYDROBROMIDE 10 MG PO TABS: 10.0000 mg | ORAL_TABLET | Freq: Every evening | ORAL | Status: DC

## 2014-04-11 NOTE — Telephone Encounter (Signed)
Patient is scheduled to see Dr. Tawanna Coolerodd on 04/17/14.  She is out of medication and would like to know if you can fill her medication?

## 2014-04-11 NOTE — Addendum Note (Signed)
Addended by: Johnella MoloneyFUNDERBURK, JO A on: 04/11/2014 03:14 PM   Modules accepted: Orders

## 2014-04-11 NOTE — Telephone Encounter (Signed)
Rx done. 

## 2014-04-17 ENCOUNTER — Encounter: Payer: Self-pay | Admitting: Family Medicine

## 2014-04-17 ENCOUNTER — Ambulatory Visit (INDEPENDENT_AMBULATORY_CARE_PROVIDER_SITE_OTHER): Payer: Managed Care, Other (non HMO) | Admitting: Family Medicine

## 2014-04-17 VITALS — BP 120/84 | Temp 98.4°F | Wt 113.0 lb

## 2014-04-17 DIAGNOSIS — F410 Panic disorder [episodic paroxysmal anxiety] without agoraphobia: Secondary | ICD-10-CM

## 2014-04-17 MED ORDER — CITALOPRAM HYDROBROMIDE 10 MG PO TABS: 10.0000 mg | ORAL_TABLET | Freq: Every evening | ORAL | Status: DC

## 2014-04-17 NOTE — Progress Notes (Signed)
Pre visit review using our clinic review tool, if applicable. No additional management support is needed unless otherwise documented below in the visit note. 

## 2014-04-17 NOTE — Progress Notes (Signed)
   Subjective:    Patient ID: [name], female    DOB: 12-11-1975, 39 y.o.   MRN: [id number]  HPI [name] is a 39 year old female who comes in today for follow-up of panic attacks  She's been on the Celexa 10 mg daily has done well wishes to continue that dose. No side effects from medication  She gets her physicals by her GYN  She has light skin and light eyes and gets a dermatologic evaluation by her dermatologist yearly.   Review of Systems    review of systems otherwise negative Objective:   Physical Exam  Well-developed well-nourished female no acute distress vital signs stable she's afebrile oriented 3 smiling doing well.      Assessment & Plan:  History of panic attacks doing well on Celexa 10 mg daily................ continue that dose return in one year.Marland Kitchen..Marland Kitchen

## 2014-04-17 NOTE — Patient Instructions (Signed)
Celexa 10 mg...Marland Kitchen.Marland Kitchen.Marland Kitchen. 1 daily  Return in one year sooner if any problems

## 2014-05-31 ENCOUNTER — Other Ambulatory Visit: Payer: Self-pay | Admitting: Obstetrics and Gynecology

## 2014-05-31 DIAGNOSIS — R928 Other abnormal and inconclusive findings on diagnostic imaging of breast: Secondary | ICD-10-CM

## 2014-06-02 ENCOUNTER — Ambulatory Visit
Admission: RE | Admit: 2014-06-02 | Discharge: 2014-06-02 | Disposition: A | Discharge status: Home / Self Care | Payer: Managed Care, Other (non HMO) | Source: Ambulatory Visit | Attending: Obstetrics and Gynecology | Admitting: Obstetrics and Gynecology

## 2014-06-02 DIAGNOSIS — R928 Other abnormal and inconclusive findings on diagnostic imaging of breast: Secondary | ICD-10-CM

## 2014-09-10 ENCOUNTER — Other Ambulatory Visit: Payer: Self-pay | Admitting: Family Medicine

## 2014-12-16 ENCOUNTER — Other Ambulatory Visit: Payer: Self-pay | Admitting: Family Medicine

## 2015-03-17 ENCOUNTER — Other Ambulatory Visit: Payer: Self-pay | Admitting: Family Medicine

## 2015-06-13 ENCOUNTER — Other Ambulatory Visit: Payer: Self-pay | Admitting: Family Medicine

## 2015-06-26 ENCOUNTER — Telehealth: Payer: Self-pay | Admitting: Family Medicine

## 2015-06-26 NOTE — Telephone Encounter (Signed)
Refilled has been denied because patient needs an office visit.  Note sent to the pharmacy.

## 2015-06-26 NOTE — Telephone Encounter (Signed)
Pt need new Rx for citalopram (celexa) 10 mg      Pharm:  [facility] [address]

## 2015-08-02 ENCOUNTER — Other Ambulatory Visit: Payer: Self-pay | Admitting: Family Medicine

## 2015-10-07 ENCOUNTER — Other Ambulatory Visit: Payer: Self-pay | Admitting: Family Medicine

## 2015-10-09 ENCOUNTER — Telehealth: Payer: Self-pay | Admitting: Emergency Medicine

## 2015-10-09 NOTE — Telephone Encounter (Signed)
Called pt and left message for her to return call to office Per Dr.Todd she needs to establish care with a new provider.

## 2016-11-14 ENCOUNTER — Encounter: Payer: Self-pay | Admitting: Family Medicine

## 2022-04-04 ENCOUNTER — Emergency Department (HOSPITAL_BASED_OUTPATIENT_CLINIC_OR_DEPARTMENT_OTHER): Payer: Commercial Managed Care - PPO | Admitting: Radiology

## 2022-04-04 ENCOUNTER — Encounter (HOSPITAL_BASED_OUTPATIENT_CLINIC_OR_DEPARTMENT_OTHER): Payer: Self-pay

## 2022-04-04 ENCOUNTER — Other Ambulatory Visit: Payer: Self-pay

## 2022-04-04 ENCOUNTER — Emergency Department (HOSPITAL_BASED_OUTPATIENT_CLINIC_OR_DEPARTMENT_OTHER)
Admission: EM | Admit: 2022-04-04 | Discharge: 2022-04-04 | Disposition: A | Discharge status: Home / Self Care | Payer: Commercial Managed Care - PPO | Attending: Emergency Medicine | Admitting: Emergency Medicine

## 2022-04-04 DIAGNOSIS — R109 Unspecified abdominal pain: Secondary | ICD-10-CM

## 2022-04-04 DIAGNOSIS — R1084 Generalized abdominal pain: Secondary | ICD-10-CM | POA: Diagnosis present

## 2022-04-04 DIAGNOSIS — K2970 Gastritis, unspecified, without bleeding: Secondary | ICD-10-CM

## 2022-04-04 LAB — URINALYSIS, ROUTINE W REFLEX MICROSCOPIC
Bilirubin Urine: NEGATIVE
Glucose, UA: NEGATIVE mg/dL
Hgb urine dipstick: NEGATIVE
Ketones, ur: NEGATIVE mg/dL
Leukocytes,Ua: NEGATIVE
Nitrite: NEGATIVE
Protein, ur: NEGATIVE mg/dL
Specific Gravity, Urine: 1.019 (ref 1.005–1.030)
pH: 6 (ref 5.0–8.0)

## 2022-04-04 LAB — CBC
HCT: 34.6 % — ABNORMAL LOW (ref 36.0–46.0)
Hemoglobin: 12.7 g/dL (ref 12.0–15.0)
MCH: 31.6 pg (ref 26.0–34.0)
MCHC: 36.7 g/dL — ABNORMAL HIGH (ref 30.0–36.0)
MCV: 86.1 fL (ref 80.0–100.0)
Platelets: 217 10*3/uL (ref 150–400)
RBC: 4.02 MIL/uL (ref 3.87–5.11)
RDW: 11.7 % (ref 11.5–15.5)
WBC: 9.7 10*3/uL (ref 4.0–10.5)
nRBC: 0 % (ref 0.0–0.2)

## 2022-04-04 LAB — COMPREHENSIVE METABOLIC PANEL
ALT: 8 U/L (ref 0–44)
AST: 12 U/L — ABNORMAL LOW (ref 15–41)
Albumin: 4.4 g/dL (ref 3.5–5.0)
Alkaline Phosphatase: 74 U/L (ref 38–126)
Anion gap: 8 (ref 5–15)
BUN: 16 mg/dL (ref 6–20)
CO2: 25 mmol/L (ref 22–32)
Calcium: 10.1 mg/dL (ref 8.9–10.3)
Chloride: 99 mmol/L (ref 98–111)
Creatinine, Ser: 0.71 mg/dL (ref 0.44–1.00)
GFR, Estimated: >60 mL/min (ref >60)
Glucose, Bld: 106 mg/dL — ABNORMAL HIGH (ref 70–99)
Potassium: 4 mmol/L (ref 3.5–5.1)
Sodium: 132 mmol/L — ABNORMAL LOW (ref 135–145)
Total Bilirubin: 0.4 mg/dL (ref 0.3–1.2)
Total Protein: 7.7 g/dL (ref 6.5–8.1)

## 2022-04-04 LAB — LIPASE, BLOOD: Lipase: 19 U/L (ref 11–51)

## 2022-04-04 LAB — PREGNANCY, URINE: Preg Test, Ur: NEGATIVE

## 2022-04-04 MED ORDER — ONDANSETRON HCL 4 MG/2ML IJ SOLN
4.0000 mg | Freq: Once | INTRAMUSCULAR | Status: DC
  Filled 2022-04-04: qty 2

## 2022-04-04 MED ORDER — FAMOTIDINE IN NACL 20-0.9 MG/50ML-% IV SOLN: 20.0000 mg | Freq: Once | INTRAVENOUS | Status: DC

## 2022-04-04 MED ORDER — SODIUM CHLORIDE 0.9 % IV BOLUS: 1000.0000 mL | Freq: Once | INTRAVENOUS | Status: DC

## 2022-04-04 NOTE — Discharge Instructions (Signed)
You likely have some gastritis.  I recommend avoiding spicy food.  You can take Alka-Seltzer or Gas-X.  Consider Pepcid as needed as well  See your doctor for follow-up  Return to ER if you have worse abdominal pain, vomiting

## 2022-04-04 NOTE — ED Notes (Signed)
Patient transported to X-ray 

## 2022-04-04 NOTE — ED Triage Notes (Signed)
Patient here POV from Home.  Endorses Mid ABD pain that began this AM. Was originally more right sided and then left sided but now has subsided almost Completely.   No N/V/D. Burping helped alleviate Pain. No fevers. No Dysuria.   NAD Noted during Triage. A&Ox4. GCS 15. Ambulatory.

## 2022-04-04 NOTE — ED Provider Notes (Signed)
[facility] Provider Note   CSN: [id number] Arrival date & time: 04/04/22  1953     History  Chief Complaint  Patient presents with   Abdominal Pain    [name] is a 47 y.o. female here presenting with abdominal cramps.  Patient states that she ate some spicy [place] food yesterday.  She has some diffuse abdominal cramps.  She went to [facility] urgent care and was sent here for further evaluation.  She states that she took some Gas-X and Alka-Seltzer and felt better.  Denies any fevers or vomiting.  The history is provided by the patient.       Home Medications Prior to Admission medications   Medication Sig Start Date End Date Taking? Authorizing Provider  citalopram (CELEXA) 10 MG tablet TAKE ONE TABLET BY MOUTH ONE TIME DAILY IN THE EVENING 10/09/15   Dorena Cookey, MD  ibuprofen (ADVIL,MOTRIN) 200 MG tablet Take 400 mg by mouth every 6 (six) hours as needed for mild pain or moderate pain.    [provider]  progesterone (PROMETRIUM) 100 MG capsule  04/12/14   [provider]      Allergies    Patient has no known allergies.    Review of Systems   Review of Systems  Gastrointestinal:  Positive for abdominal pain.  All other systems reviewed and are negative.   Physical Exam Updated Vital Signs BP (!) 154/89 (BP Location: Right Arm)   Pulse 83   Temp 98.3 F (36.8 C) (Oral)   Resp 18   Ht 5' 1"$  (1.549 m)   Wt 49.9 kg   SpO2 100%   BMI 20.78 kg/m  Physical Exam Vitals and nursing note reviewed.  Constitutional:      Appearance: She is well-developed.  HENT:     Head: Normocephalic.  Eyes:     Extraocular Movements: Extraocular movements intact.     Pupils: Pupils are equal, round, and reactive to light.  Cardiovascular:     Rate and Rhythm: Normal rate and regular rhythm.     Heart sounds: Normal heart sounds.  Pulmonary:     Effort: Pulmonary effort is normal.     Breath sounds: Normal  breath sounds.  Abdominal:     General: Abdomen is flat.     Comments: No abdominal tenderness  Skin:    General: Skin is warm.     Capillary Refill: Capillary refill takes less than 2 seconds.  Neurological:     General: No focal deficit present.     Mental Status: She is alert and oriented to person, place, and time.  Psychiatric:        Mood and Affect: Mood normal.        Behavior: Behavior normal.     ED Results / Procedures / Treatments   Labs (all labs ordered are listed, but only abnormal results are displayed) Labs Reviewed  COMPREHENSIVE METABOLIC PANEL - Abnormal; Notable for the following components:      Result Value   Sodium 132 (*)    Glucose, Bld 106 (*)    AST 12 (*)    All other components within normal limits  CBC - Abnormal; Notable for the following components:   HCT 34.6 (*)    MCHC 36.7 (*)    All other components within normal limits  LIPASE, BLOOD  URINALYSIS, ROUTINE W REFLEX MICROSCOPIC  PREGNANCY, URINE    EKG None  Radiology DG ABD ACUTE 2+V W  1V CHEST  Result Date: 04/04/2022 CLINICAL DATA:  Abdominal pain, nausea EXAM: DG ABDOMEN ACUTE WITH 1 VIEW CHEST COMPARISON:  CT 01/06/2013 FINDINGS: There is no evidence of dilated bowel loops or free intraperitoneal air. No radiopaque calculi or other significant radiographic abnormality is seen. Heart size and mediastinal contours are within normal limits. Both lungs are clear. IMPRESSION: Negative abdominal radiographs.  No acute cardiopulmonary disease. Electronically Signed   By: Rolm Baptise M.D.   On: 04/04/2022 21:13    Procedures Procedures    Medications Ordered in ED Medications  ondansetron [facility]) injection 4 mg (4 mg Intravenous Not Given 04/04/22 2110)    ED Course/ Medical Decision Making/ A&P                             Medical Decision Making [name] is a 47 y.o. female here presenting with abdominal cramps.  Patient ate some spicy food so likely has some gastritis.  Also  consider gas.  Will get CBC and CMP and urinalysis and x-rays.  If x-rays did not show any SBO, patient does not need CT right now   9:41 PM Labs and x-rays are unremarkable.  I ordered fluids initially and Zofran but patient states that she is able to tolerate p.o. and felt better.  Stable for discharge   Problems Addressed: Abdominal cramping: acute illness or injury Gastritis without bleeding, unspecified chronicity, unspecified gastritis type: acute illness or injury  Amount and/or Complexity of Data Reviewed Labs: ordered. Decision-making details documented in ED Course. Radiology: ordered and independent interpretation performed. Decision-making details documented in ED Course.  Risk Prescription drug management.    Final Clinical Impression(s) / ED Diagnoses Final diagnoses:  None    Rx / DC Orders ED Discharge Orders     None         Drenda Freeze, MD 04/04/22 2143

## 2022-04-04 NOTE — ED Notes (Signed)
Reviewed AVS/discharge instruction with patient. Time allotted for and all questions answered. Patient is agreeable for d/c and escorted to ed exit by staff.
